# Patient Record
Sex: Female | Born: 1945 | Race: Black or African American | Hispanic: No | Marital: Single | Smoking: Never smoker
Health system: Southern US, Community
[De-identification: ages and names within clinical notes are randomized; demographics above are authoritative.]

## PROBLEM LIST (undated history)

## (undated) DIAGNOSIS — E785 Hyperlipidemia, unspecified: Secondary | ICD-10-CM

## (undated) DIAGNOSIS — K5792 Diverticulitis of intestine, part unspecified, without perforation or abscess without bleeding: Secondary | ICD-10-CM

## (undated) DIAGNOSIS — I10 Essential (primary) hypertension: Secondary | ICD-10-CM

## (undated) DIAGNOSIS — D219 Benign neoplasm of connective and other soft tissue, unspecified: Secondary | ICD-10-CM

## (undated) HISTORY — DX: Diverticulitis of intestine, part unspecified, without perforation or abscess without bleeding: K57.92

## (undated) HISTORY — PX: FINGER SURGERY: SHX640

## (undated) HISTORY — DX: Benign neoplasm of connective and other soft tissue, unspecified: D21.9

## (undated) HISTORY — DX: Essential (primary) hypertension: I10

## (undated) HISTORY — DX: Hyperlipidemia, unspecified: E78.5

## (undated) HISTORY — PX: REDUCTION MAMMAPLASTY: SUR839

## (undated) HISTORY — PX: FOOT SURGERY: SHX648

---

## 2005-10-25 ENCOUNTER — Encounter: Admission: RE | Admit: 2005-10-25 | Discharge: 2005-10-25 | Payer: Self-pay | Admitting: Obstetrics and Gynecology

## 2006-11-07 ENCOUNTER — Encounter: Admission: RE | Admit: 2006-11-07 | Discharge: 2006-11-07 | Payer: Self-pay | Admitting: Obstetrics and Gynecology

## 2006-11-20 ENCOUNTER — Encounter: Admission: RE | Admit: 2006-11-20 | Discharge: 2006-11-20 | Payer: Self-pay | Admitting: Obstetrics and Gynecology

## 2007-11-25 ENCOUNTER — Encounter: Admission: RE | Admit: 2007-11-25 | Discharge: 2007-11-25 | Payer: Self-pay | Admitting: Obstetrics and Gynecology

## 2008-06-17 ENCOUNTER — Ambulatory Visit (HOSPITAL_BASED_OUTPATIENT_CLINIC_OR_DEPARTMENT_OTHER): Admission: RE | Admit: 2008-06-17 | Discharge: 2008-06-17 | Payer: Self-pay | Admitting: Orthopedic Surgery

## 2008-06-17 ENCOUNTER — Encounter (INDEPENDENT_AMBULATORY_CARE_PROVIDER_SITE_OTHER): Payer: Self-pay | Admitting: Orthopedic Surgery

## 2008-11-30 ENCOUNTER — Encounter: Admission: RE | Admit: 2008-11-30 | Discharge: 2008-11-30 | Payer: Self-pay | Admitting: Obstetrics and Gynecology

## 2008-12-12 ENCOUNTER — Encounter: Admission: RE | Admit: 2008-12-12 | Discharge: 2008-12-12 | Payer: Self-pay | Admitting: Internal Medicine

## 2009-12-01 ENCOUNTER — Encounter: Admission: RE | Admit: 2009-12-01 | Discharge: 2009-12-01 | Payer: Self-pay | Admitting: Obstetrics and Gynecology

## 2010-03-25 ENCOUNTER — Encounter: Payer: Self-pay | Admitting: Obstetrics and Gynecology

## 2010-06-13 LAB — BASIC METABOLIC PANEL
BUN: 7 mg/dL (ref 6–23)
CO2: 28 mEq/L (ref 19–32)
Calcium: 9 mg/dL (ref 8.4–10.5)
Chloride: 105 mEq/L (ref 96–112)
Creatinine, Ser: 0.79 mg/dL (ref 0.4–1.2)
GFR calc Af Amer: >60 mL/min (ref >60)
GFR calc non Af Amer: >60 mL/min (ref >60)
Glucose, Bld: 115 mg/dL — ABNORMAL HIGH (ref 70–99)
Potassium: 4.4 mEq/L (ref 3.5–5.1)
Sodium: 138 mEq/L (ref 135–145)

## 2010-06-13 LAB — POCT HEMOGLOBIN-HEMACUE: Hemoglobin: 14.7 g/dL (ref 12.0–15.0)

## 2010-07-17 NOTE — Op Note (Signed)
NAMECERITA, RABELO               ACCOUNT NO.:  [id number]   MEDICAL RECORD NO.:  [id number]          PATIENT TYPE:  AMB   LOCATION:  DSC                          FACILITY:  [facility]   PHYSICIAN:  Katy Fitch. Sypher, M.D. DATE OF BIRTH:  May 11, 1945   DATE OF PROCEDURE:  06/17/2008  DATE OF DISCHARGE:                               OPERATIVE REPORT   PREOPERATIVE DIAGNOSIS:  Pigmented lesion left index finger radial nail  fold with nail discoloration.   POSTOPERATIVE DIAGNOSIS:  Uncertain pending pathologic evaluation.   OPERATIONS:  En bloc excision left index radial nail fold with a 2-mm  resection of the nail plate and ventral intermediate and dorsal nail  fold followed by primary reconstruction of radial nail wall.   OPERATIONS:  Katy Fitch. Sypher, MD   ASSISTANT:  Annye Rusk, PA-C   ANESTHESIA:  Lidocaine 2% metacarpal head level block left index finger  supplemented by IV sedation.   SUPERVISING ANESTHESIOLOGIST:  Zenon Mayo, MD   INDICATIONS:  Dana Gregory is a 65 year old woman referred for  evaluation and management of a chronic radial nail wall lesion of the  left index finger.  She had been seen by Dr. Campbell Stall in the past for  evaluation and management.  She had had biopsy of the nail wall without  definitive correction.  We are uncertain as to her original diagnosis.  She presented for evaluation of her nail requesting excisional biopsy.  She is brought to the operating room at this time for the biopsy and  definitive diagnosis.   PROCEDURE:  Dana Gregory is brought to the operating room and placed in  supine position upon the operating table.  Following light sedation, the  left arm was prepped with Betadine soap solution and sterilely draped.  A 2% lidocaine metacarpal head level block was placed without  complication.  After few moments, the anesthesia was complete.  The left  index finger was exsanguinated with a gauze wrap and a 1/2-inch  Penrose  drain placed as a digital tourniquet.  The procedure commenced with a  split of the nail just ulnar to the anticipated line of resection.  An  en bloc resection of the entire lesion with a 2-mm margin was  accomplished including the entire dorsal, intermediate and ventral nail  fold on the radial aspect of the fingernail.  This was taken directly to  the periosteum of the distal phalanx and complete excision including the  periosteum was accomplished.  The mass was passed in formalin for  pathologic evaluation.  The wound was then repaired with mattress  sutures of 5-0 nylon reconstructing the radial nail wall.  There were no  apparent complications.   Dana Gregory tolerated the surgery and anesthesia well.  She was placed in a  compressive dressing with Xeroflo sterile gauze and Coban.   We will see her back for followup in the office in 5-7 days for dressing  change and review of her pathology.      Katy Fitch Sypher, M.D.  Electronically Signed     RVS/MEDQ  D:  06/17/2008  T:  06/18/2008  Job:  [id number]   cc:   Dr. Constance Goltz

## 2010-09-28 IMAGING — CT CT ABDOMEN W/ CM
2 of 5 series · 17 of 46 positions shown, 19 images · IV contrast (CONTRAST)
Comparison: None

CT ABDOMEN

CLINICAL DATA: Persistent abdominal pain over last year

CT ABDOMEN AND PELVIS WITH CONTRAST
TECHNIQUE: Multidetector CT imaging of the abdomen and pelvis was
performed using the standard protocol following bolus
administration of intravenous contrast.
Contrast: 100 ml [id number]
BUN and creatinine were obtained on site.  Results:  BUN 10 mg/dL,
Creatinine 0.9 mg/dL.

[Series 2: portal · axial · portal-venous · 0.68mm/px · z∈[+1219,+1599]mm · 14 of 86 slices shown, 16 images]
[im 5/86  soft-tissue]
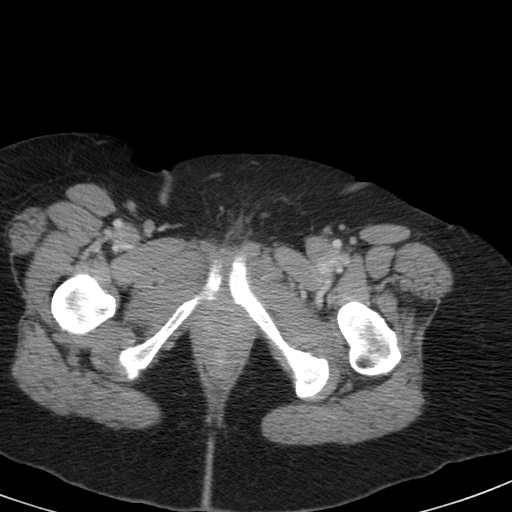
[im 5/86  bone]
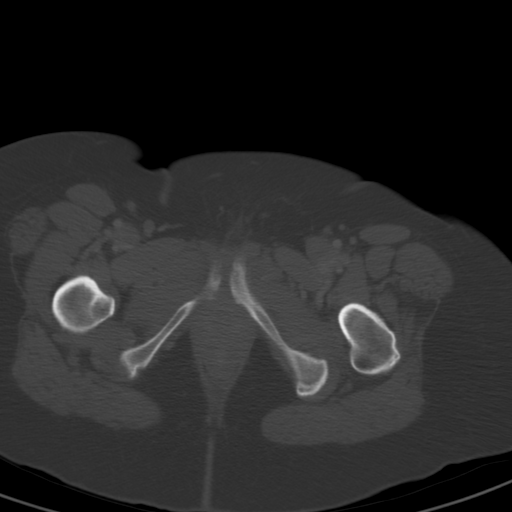
[im 10/86  soft-tissue]
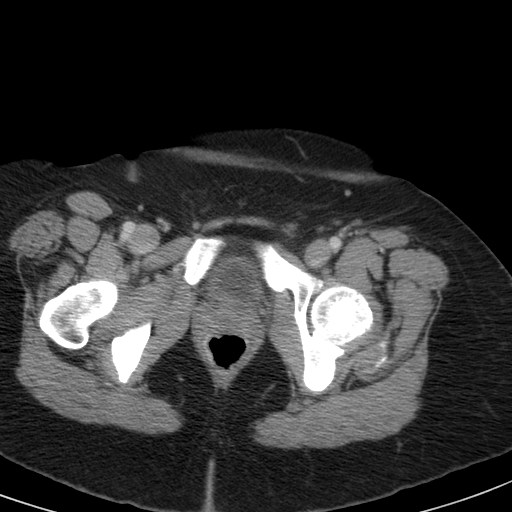
[im 19/86  soft-tissue]
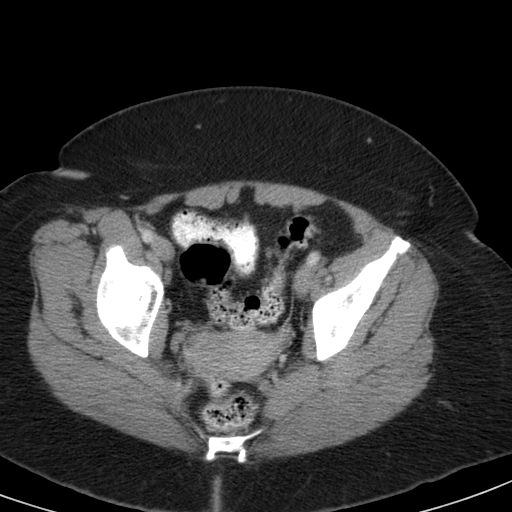
[im 24/86  soft-tissue]
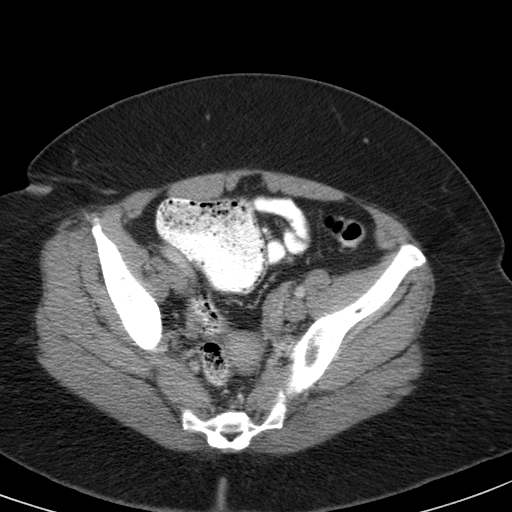
[im 29/86  soft-tissue]
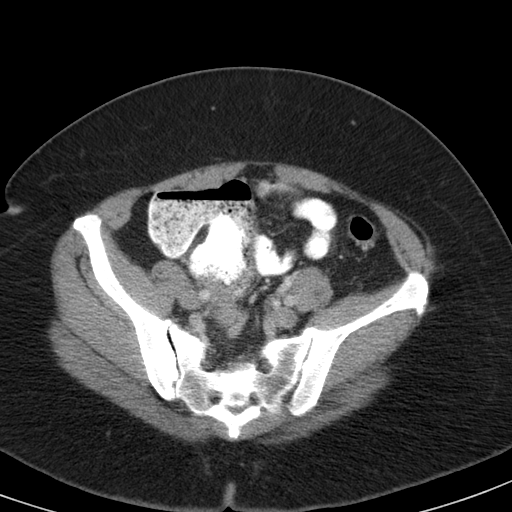
[im 34/86  soft-tissue]
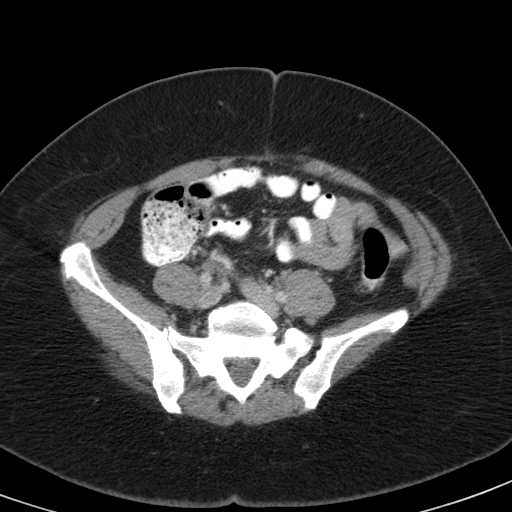
[im 38/86  soft-tissue]
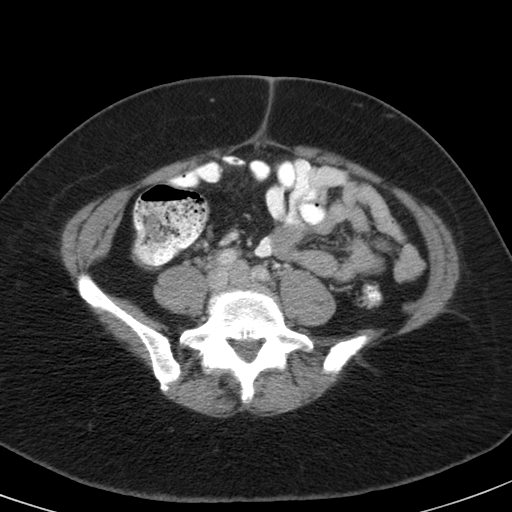
[im 48/86  soft-tissue]
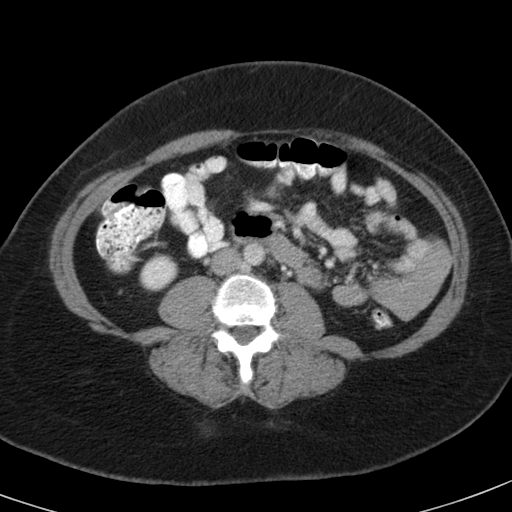
[im 52/86  soft-tissue]
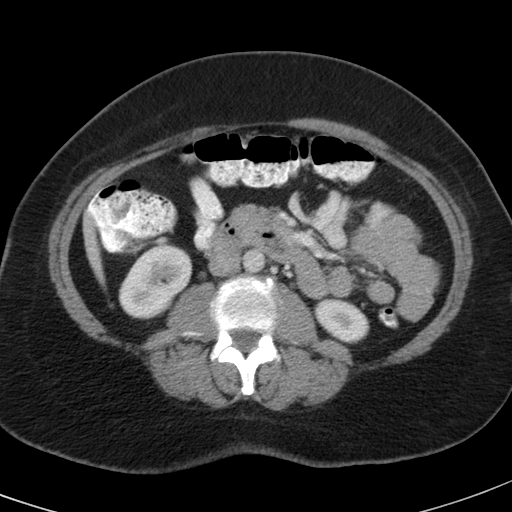
[im 52/86  bone]
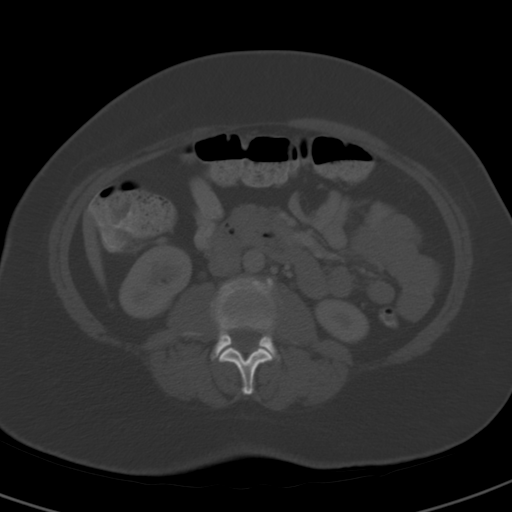
[im 57/86  soft-tissue]
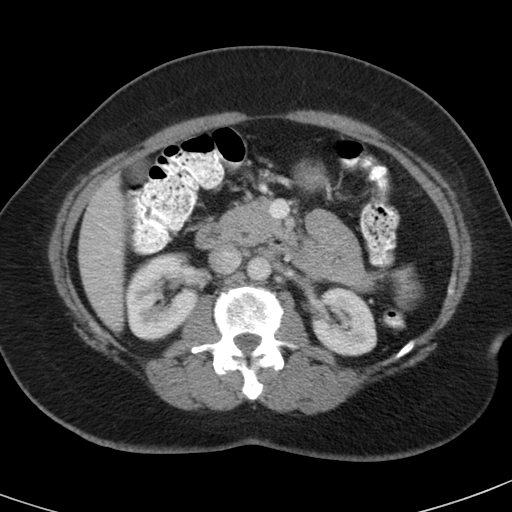
[im 62/86  soft-tissue]
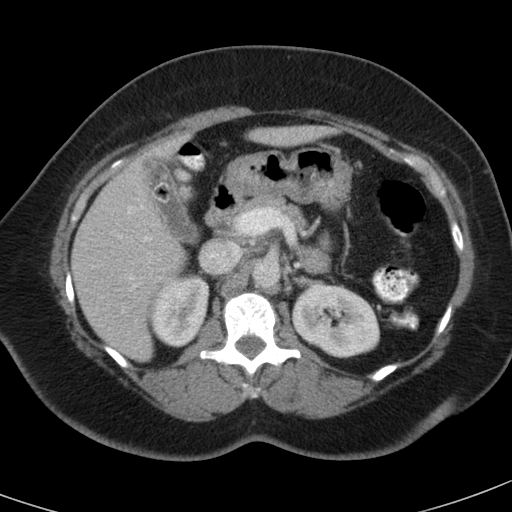
[im 67/86  soft-tissue]
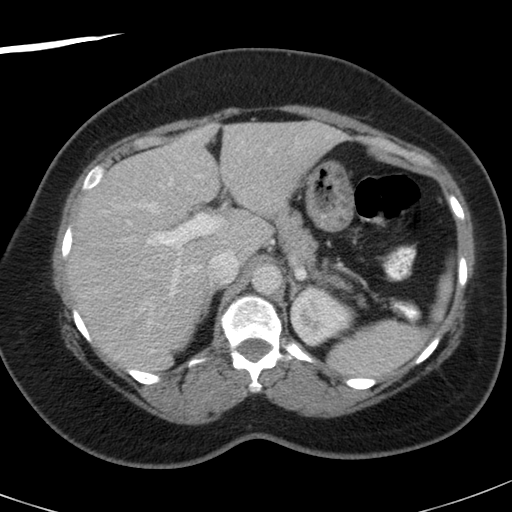
[im 76/86  soft-tissue]
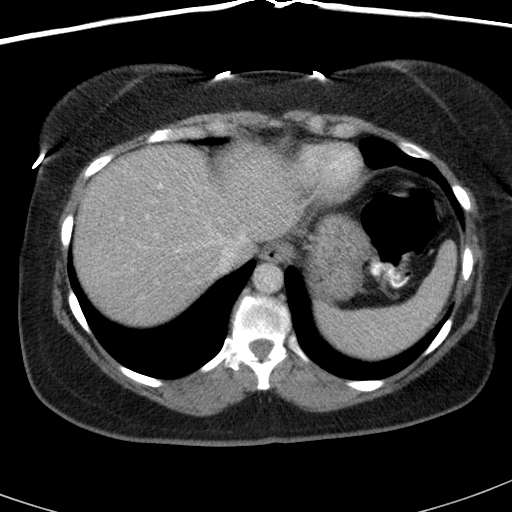
[im 81/86  soft-tissue]
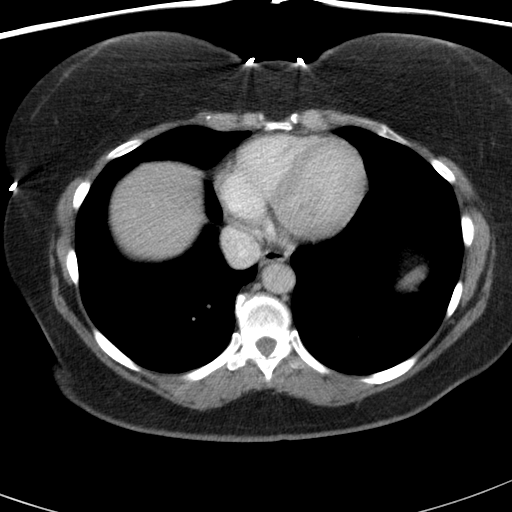

[coronals · coronal · 0.83mm/px · 3 of 76 slices shown]
[im 26/76  soft-tissue]
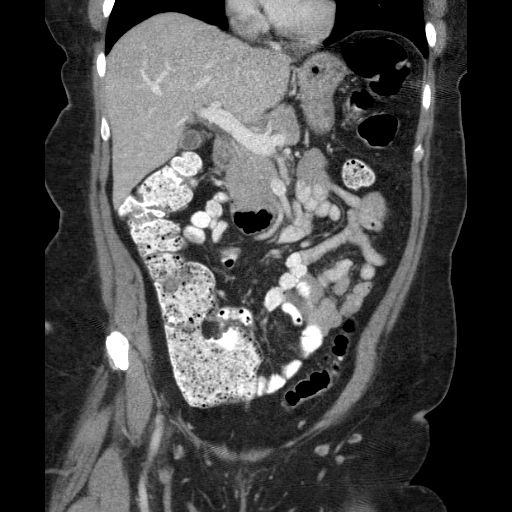
[im 34/76  soft-tissue]
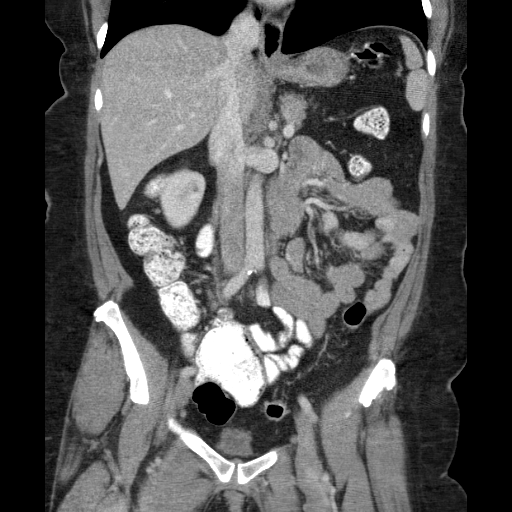
[im 42/76  soft-tissue]
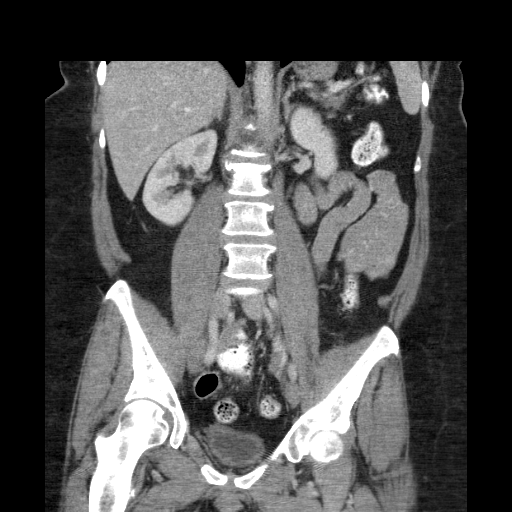

[17 of 46 positions shown; findings below may reference images not displayed]

FINDINGS: The lung bases are clear.  The liver enhances with no
focal abnormality and no ductal dilatation is seen.  Several
gallstones are present within the gallbladder with one of the
larger stones measuring 14 mm in maximum diameter.  No gallbladder
wall thickening is seen.  The pancreas is normal in size and the
pancreatic duct is not dilated.  The adrenal glands and spleen
appear normal.  The stomach is not well distended.  The kidneys
enhance with no calculi or mass.  On delayed images the
pelvocaliceal systems appear normal.  The abdominal aorta is normal
in caliber.  No adenopathy is seen.
IMPRESSION: Several gallstones within the gallbladder.  No ductal dilatation
or gallbladder wall thickening is seen.

CT PELVIS
FINDINGS: No colonic abnormality is seen.  There are a few
rectosigmoid colonic diverticula present.  The uterus is normal in
size.  No adnexal lesion is seen.  No fluid is noted within the
pelvis.  The urinary bladder is not well distended and is therefore
difficult to assess.  The terminal ileum is well seen and appears
normal.  There are degenerative changes involving the facet joints
of the lower lumbar spine.
IMPRESSION: 1.  There are a few rectosigmoid colonic diverticula present.  No
diverticulitis is seen.
2.  Degenerative change involves the facet joints of the lower
lumbar spine.

## 2010-11-22 ENCOUNTER — Other Ambulatory Visit: Payer: Self-pay | Admitting: Obstetrics and Gynecology

## 2010-11-22 DIAGNOSIS — Z1231 Encounter for screening mammogram for malignant neoplasm of breast: Secondary | ICD-10-CM

## 2010-12-03 ENCOUNTER — Ambulatory Visit
Admission: RE | Admit: 2010-12-03 | Discharge: 2010-12-03 | Disposition: A | Discharge status: Home / Self Care | Payer: Medicare Other | Source: Ambulatory Visit | Attending: Obstetrics and Gynecology | Admitting: Obstetrics and Gynecology

## 2010-12-03 DIAGNOSIS — Z1231 Encounter for screening mammogram for malignant neoplasm of breast: Secondary | ICD-10-CM

## 2011-04-22 ENCOUNTER — Other Ambulatory Visit: Payer: Self-pay | Admitting: Obstetrics and Gynecology

## 2011-04-22 ENCOUNTER — Other Ambulatory Visit (HOSPITAL_COMMUNITY)
Admission: RE | Admit: 2011-04-22 | Discharge: 2011-04-22 | Disposition: A | Discharge status: Home / Self Care | Payer: Medicare Other | Source: Ambulatory Visit | Attending: Obstetrics and Gynecology | Admitting: Obstetrics and Gynecology

## 2011-04-22 DIAGNOSIS — Z124 Encounter for screening for malignant neoplasm of cervix: Secondary | ICD-10-CM | POA: Insufficient documentation

## 2011-11-06 ENCOUNTER — Other Ambulatory Visit: Payer: Self-pay | Admitting: Obstetrics and Gynecology

## 2011-11-06 DIAGNOSIS — Z1231 Encounter for screening mammogram for malignant neoplasm of breast: Secondary | ICD-10-CM

## 2011-12-06 ENCOUNTER — Ambulatory Visit
Admission: RE | Admit: 2011-12-06 | Discharge: 2011-12-06 | Disposition: A | Discharge status: Home / Self Care | Payer: Medicare Other | Source: Ambulatory Visit | Attending: Obstetrics and Gynecology | Admitting: Obstetrics and Gynecology

## 2011-12-06 DIAGNOSIS — Z1231 Encounter for screening mammogram for malignant neoplasm of breast: Secondary | ICD-10-CM

## 2012-11-27 ENCOUNTER — Other Ambulatory Visit: Payer: Self-pay

## 2012-11-27 DIAGNOSIS — Z1231 Encounter for screening mammogram for malignant neoplasm of breast: Secondary | ICD-10-CM

## 2012-12-25 ENCOUNTER — Ambulatory Visit
Admission: RE | Admit: 2012-12-25 | Discharge: 2012-12-25 | Disposition: A | Discharge status: Home / Self Care | Payer: Medicare Other | Source: Ambulatory Visit

## 2012-12-25 DIAGNOSIS — Z1231 Encounter for screening mammogram for malignant neoplasm of breast: Secondary | ICD-10-CM

## 2013-10-25 ENCOUNTER — Other Ambulatory Visit: Payer: Self-pay | Admitting: *Deleted

## 2013-10-25 ENCOUNTER — Encounter: Payer: Self-pay | Admitting: Obstetrics & Gynecology

## 2013-10-25 ENCOUNTER — Ambulatory Visit (INDEPENDENT_AMBULATORY_CARE_PROVIDER_SITE_OTHER): Payer: Medicare Other | Admitting: Obstetrics & Gynecology

## 2013-10-25 VITALS — BP 160/96 | Temp 98.1°F | Ht 64.0 in | Wt 204.0 lb

## 2013-10-25 DIAGNOSIS — N899 Noninflammatory disorder of vagina, unspecified: Secondary | ICD-10-CM

## 2013-10-25 DIAGNOSIS — N898 Other specified noninflammatory disorders of vagina: Secondary | ICD-10-CM

## 2013-10-25 LAB — POCT URINALYSIS DIPSTICK
Bilirubin, UA: NEGATIVE
Glucose, UA: NEGATIVE
Ketones, UA: NEGATIVE
Leukocytes, UA: NEGATIVE
Nitrite, UA: NEGATIVE
Protein, UA: NEGATIVE
Spec Grav, UA: 1.015
Urobilinogen, UA: NEGATIVE
pH, UA: 6

## 2013-10-25 MED ORDER — CLINDAMYCIN PHOSPHATE (1 DOSE) 2 % VA CREA: 1.0000 | TOPICAL_CREAM | Freq: Once | VAGINAL | Status: DC

## 2013-10-26 LAB — WET PREP BY MOLECULAR PROBE
Candida species: POSITIVE — AB
Gardnerella vaginalis: NEGATIVE
Trichomonas vaginosis: NEGATIVE

## 2013-10-27 LAB — POCT WET PREP (WET MOUNT)
Clue Cells Wet Prep Whiff POC: NEGATIVE
KOH Wet Prep POC: NEGATIVE

## 2013-10-27 NOTE — Progress Notes (Signed)
Patient ID: Dana Gregory, female   DOB: 05/28/1945, 68 y.o.   MRN: [id number]  Chief Complaint  Patient presents with  . Problem    Vaginal discharge/irritation    HPI Dana Gregory is a 68 y.o. female.   Vaginal Discharge The patient's primary symptoms include a vaginal discharge. Primary symptoms comment: vaginal irritattion. The current episode started 1 to 4 weeks ago. The problem affects both sides. Pertinent negatives include no rash.  Reports frequent bubble baths.  Past Medical History  Diagnosis Date  . Hypertension   . Hyperlipidemia   . Diverticulitis   . Fibroid     Past Surgical History  Procedure Laterality Date  . Foot surgery    . Finger surgery      Family History  Problem Relation Age of Onset  . Heart disease Mother   . Hypertension Mother     Social History History  Substance Use Topics  . Smoking status: Never Smoker   . Smokeless tobacco: Never Used  . Alcohol Use: No    No Known Allergies  Current Outpatient Prescriptions  Medication Sig Dispense Refill  . AMLODIPINE BESYLATE PO Take 5 mg by mouth.      . pravastatin (PRAVACHOL) 40 MG tablet Take 40 mg by mouth daily.      . Clindamycin Phosphate, 1 Dose, (CLINDESSE) vaginal cream Apply 1 Applicatorful topically once.  5.8 g  0   No current facility-administered medications for this visit.    Review of Systems Review of Systems  Genitourinary: Positive for vaginal discharge.  Skin: Negative for rash.   Constitutional: negative for fatigue and weight loss Respiratory: negative for cough and wheezing Cardiovascular: negative for chest pain, fatigue and palpitations Gastrointestinal: negative for abdominal pain and change in bowel habits Integument/breast: negative for nipple discharge Musculoskeletal:negative for myalgias Neurological: negative for gait problems and tremors Behavioral/Psych: negative for abusive relationship, depression Endocrine: negative for temperature  intolerance     Blood pressure 160/96, temperature 98.1 F (36.7 C), height 5\' 4"  (1.626 m), weight 92.534 kg (204 lb).  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 WET PREP BY MOLECULAR PROBE  . POCT urinalysis dipstick   Meds ordered this encounter  Medications  . AMLODIPINE BESYLATE PO    Sig: Take 5 mg by mouth.  . pravastatin (PRAVACHOL) 40 MG tablet    Sig: Take 40 mg by mouth daily.  Marland Kitchen DISCONTD: Clindamycin Phosphate, 1 Dose, (CLINDESSE) vaginal cream    Sig: Apply 1 Applicatorful topically once.    Dispense:  5.8 g    Refill:  0  . Clindamycin Phosphate, 1 Dose, (CLINDESSE) vaginal cream    Sig: Apply 1 Applicatorful topically once.    Dispense:  5.8 g    Refill:  0     Possible management options include: counseled re: avoidance of irritants Follow up as needed.        JACKSON-MOORE,Alver Leete A 10/27/2013, 7:37 PM

## 2013-10-27 NOTE — Patient Instructions (Signed)

## 2013-11-11 ENCOUNTER — Ambulatory Visit: Payer: Medicare Other | Admitting: Obstetrics & Gynecology

## 2013-12-23 ENCOUNTER — Other Ambulatory Visit: Payer: Self-pay

## 2013-12-23 DIAGNOSIS — Z1231 Encounter for screening mammogram for malignant neoplasm of breast: Secondary | ICD-10-CM

## 2014-01-10 ENCOUNTER — Ambulatory Visit: Payer: Medicare Other

## 2014-02-28 ENCOUNTER — Encounter: Payer: Self-pay | Admitting: *Deleted

## 2014-03-01 ENCOUNTER — Encounter: Payer: Self-pay | Admitting: Obstetrics & Gynecology

## 2014-03-02 ENCOUNTER — Ambulatory Visit
Admission: RE | Admit: 2014-03-02 | Discharge: 2014-03-02 | Disposition: A | Discharge status: Home / Self Care | Payer: Medicare Other | Source: Ambulatory Visit

## 2014-03-02 ENCOUNTER — Encounter (INDEPENDENT_AMBULATORY_CARE_PROVIDER_SITE_OTHER): Payer: Self-pay

## 2014-03-02 DIAGNOSIS — Z1231 Encounter for screening mammogram for malignant neoplasm of breast: Secondary | ICD-10-CM

## 2015-04-10 ENCOUNTER — Other Ambulatory Visit: Payer: Self-pay

## 2015-04-10 DIAGNOSIS — Z1231 Encounter for screening mammogram for malignant neoplasm of breast: Secondary | ICD-10-CM

## 2015-04-25 ENCOUNTER — Ambulatory Visit
Admission: RE | Admit: 2015-04-25 | Discharge: 2015-04-25 | Disposition: A | Discharge status: Home / Self Care | Payer: Medicare Other | Source: Ambulatory Visit

## 2015-04-25 DIAGNOSIS — Z1231 Encounter for screening mammogram for malignant neoplasm of breast: Secondary | ICD-10-CM

## 2016-04-16 ENCOUNTER — Other Ambulatory Visit: Payer: Self-pay | Admitting: Family Medicine

## 2016-04-16 DIAGNOSIS — Z9889 Other specified postprocedural states: Secondary | ICD-10-CM

## 2016-04-16 DIAGNOSIS — Z1231 Encounter for screening mammogram for malignant neoplasm of breast: Secondary | ICD-10-CM

## 2016-05-14 ENCOUNTER — Ambulatory Visit: Payer: Medicare Other

## 2016-05-22 ENCOUNTER — Ambulatory Visit: Payer: Medicare Other

## 2016-06-10 ENCOUNTER — Ambulatory Visit
Admission: RE | Admit: 2016-06-10 | Discharge: 2016-06-10 | Disposition: A | Discharge status: Home / Self Care | Payer: Medicare Other | Source: Ambulatory Visit | Attending: Family Medicine | Admitting: Family Medicine

## 2016-06-10 DIAGNOSIS — Z1231 Encounter for screening mammogram for malignant neoplasm of breast: Secondary | ICD-10-CM

## 2016-06-10 DIAGNOSIS — Z9889 Other specified postprocedural states: Secondary | ICD-10-CM

## 2017-05-05 ENCOUNTER — Other Ambulatory Visit: Payer: Self-pay | Admitting: Family Medicine

## 2017-05-05 DIAGNOSIS — Z1231 Encounter for screening mammogram for malignant neoplasm of breast: Secondary | ICD-10-CM

## 2017-06-11 ENCOUNTER — Ambulatory Visit
Admission: RE | Admit: 2017-06-11 | Discharge: 2017-06-11 | Disposition: A | Discharge status: Home / Self Care | Payer: Medicare Other | Source: Ambulatory Visit | Attending: Family Medicine | Admitting: Family Medicine

## 2017-06-11 DIAGNOSIS — Z1231 Encounter for screening mammogram for malignant neoplasm of breast: Secondary | ICD-10-CM

## 2018-05-06 ENCOUNTER — Encounter: Payer: Self-pay | Admitting: Obstetrics & Gynecology

## 2018-05-06 ENCOUNTER — Ambulatory Visit (INDEPENDENT_AMBULATORY_CARE_PROVIDER_SITE_OTHER): Payer: Medicare Other | Admitting: Obstetrics & Gynecology

## 2018-05-06 VITALS — BP 138/68 | HR 85 | Ht 63.0 in | Wt 212.1 lb

## 2018-05-06 DIAGNOSIS — N9489 Other specified conditions associated with female genital organs and menstrual cycle: Secondary | ICD-10-CM | POA: Diagnosis not present

## 2018-05-06 NOTE — Progress Notes (Signed)
Subjective:     Dana Gregory is a 73 y.o. female here for a routine exam.  G1P1 s/p SVD x1 in 1968. LMP early 15's. Pt did not breastfeed. Pt reports that she was on contraception for ~2 year. Pt was never on HRT. Current complaints: Pt had a CT scan that showed a mass on her ovary. She asked for a CT scan because of pancreatic cancer in her sister. She reports that she was having some 'stomach' issues prior to that. She had a f/u MRI which showed fibroids.   Pt has a h/o fibroids. Pt denies pelvic pain. No N/V.   Pt denies early satiety. No tighter.     FH: sister died pancreatic cancer. Mother died at 57 heart disease;  Father 87 died MI; sister- unknown- 'she was a bleeder'  IGynecologic History No LMP recorded. Patient is postmenopausal. Contraception: post menopausal status Last Pap: 2019. Results were: at [place] Last mammogram: 06/11/2017. Results were: normal  Obstetric History OB History  Gravida Para Term Preterm AB Living  1 1 1     1   SAB TAB Ectopic Multiple Live Births          1    # Outcome Date GA Lbr Len/2nd Weight Sex Delivery Anes PTL Lv  1 Term 1968 [redacted]w[redacted]d   F Vag-Spont EPI N LIV     The following portions of the patient's history were reviewed and updated as appropriate: allergies, current medications, past family history, past medical history, past social history, past surgical history and problem list.  Review of Systems Pertinent items are noted in HPI.    Objective:  BP 138/68   Pulse 85   Ht 5\' 3"  (1.6 m)   Wt 212 lb 1.3 oz (96.2 kg)   BMI 37.57 kg/m   CONSTITUTIONAL: Well-developed, well-nourished female in no acute distress.  HENT:  Normocephalic, atraumatic EYES: Conjunctivae and EOM are normal. No scleral icterus.  NECK: Normal range of motion SKIN: Skin is warm and dry. No rash noted. Not diaphoretic.No pallor. [place]: Alert and oriented to person, place, and time. Normal coordination.  Lungs: CTA CV: RRR Abd: obese; NT,  ND GU: EGBUS: no lesions Vagina: no blood in vault Cervix: no lesion; no mucopurulent d/c Uterus: small, mobile Adnexa: no masses palpated but, the exam is limited by pts body habitus.    04/03/2018 CLINICAL DATA: Left pelvic mass on recent CT.  EXAM: MRI PELVIS WITHOUT AND WITH CONTRAST  TECHNIQUE: Multiplanar multisequence MR imaging of the pelvis was performed both before and after administration of intravenous contrast.  CONTRAST: 18 cc MultiHance  COMPARISON: CT scan 04/03/2018  FINDINGS: Urinary Tract: Urinary bladder unremarkable. Urethra within normal limits. No urethral diverticulum.  Bowel: No small bowel or colonic dilatation within the visualized pelvis. Diverticular changes are noted in the left colon.  Vascular/Lymphatic: Normal flow signal identified in the external iliac artery and vein bilaterally. No pelvic sidewall lymphadenopathy.  Reproductive: Uterus measures 5.9 x 3.7 x 4.5 cm. Multiple small intramural fibroids are evident.  In the left pelvis, cranial to the bladder and anterior to the uterine fundus is a 7.0 x 5.7 x 5.6 cm heterogeneous well-defined mass. This generates mild mass-effect on the uterine fundus but does not appear to arise from the uterus itself. Both the previous CT and today's MRI suggests that the left gonadal vessels track into the lateral aspect of this lesion suggesting that is left ovarian in origin. The lesion has cystic and solid components  and is predominantly T2 hypointense with relatively diffuse enhancement after IV contrast administration, suggesting predominantly solid features.  Right ovary is high in the right pelvis and measures 1.1 x 2.3 x 2.5 cm.  Other: Trace fluid noted adjacent to the left adnexal lesion.  Musculoskeletal: No abnormal marrow enhancement within the visualized bony anatomy.  IMPRESSION: 1. 7 x 6 x 6 cm predominantly solid, well-defined, enhancing mass in the left adnexal space  appears to be of left ovarian origin. This lesion is discrete from the uterine fundus. 2. No evidence for lymphadenopathy in the pelvis. No gross ascites although there may be trace fluid adjacent to the left adnexal lesion. 3. Intramural uterine fibroids.   Electronically Signed  By: Misty Stanley M.D.  On: 04/20/2018 13:43  Other Result Information  Interface, Rad Results In - 04/20/2018  1:46 PM EST CLINICAL DATA:  Left pelvic mass on recent CT.  EXAM: MRI PELVIS WITHOUT AND WITH CONTRAST  TECHNIQUE: Multiplanar multisequence MR imaging of the pelvis was performed both before and after administration of intravenous contrast.  CONTRAST:  18 cc MultiHance  COMPARISON:  CT scan 04/03/2018  FINDINGS: Urinary Tract: Urinary bladder unremarkable. Urethra within normal limits. No urethral diverticulum.  Bowel: No small bowel or colonic dilatation within the visualized pelvis. Diverticular changes are noted in the left colon.  Vascular/Lymphatic: Normal flow signal identified in the external iliac artery and vein bilaterally. No pelvic sidewall lymphadenopathy.  Reproductive: Uterus measures 5.9 x 3.7 x 4.5 cm. Multiple small intramural fibroids are evident.  In the left pelvis, cranial to the bladder and anterior to the uterine fundus is a 7.0 x 5.7 x 5.6 cm heterogeneous well-defined mass. This generates mild mass-effect on the uterine fundus but does not appear to arise from the uterus itself. Both the previous CT and today's MRI suggests that the left gonadal vessels track into the lateral aspect of this lesion suggesting that is left ovarian in origin. The lesion has cystic and solid components and is predominantly T2 hypointense with relatively diffuse enhancement after IV contrast administration, suggesting predominantly solid features.  Right ovary is high in the right pelvis and measures 1.1 x 2.3 x 2.5 cm.  Other:  Trace fluid noted adjacent to the left  adnexal lesion.  Musculoskeletal: No abnormal marrow enhancement within the visualized bony anatomy.  IMPRESSION: 1. 7 x 6 x 6 cm predominantly solid, well-defined, enhancing mass in the left adnexal space appears to be of left ovarian origin. This lesion is discrete from the uterine fundus. 2. No evidence for lymphadenopathy in the pelvis. No gross ascites although there may be trace fluid adjacent to the left adnexal lesion. 3. Intramural uterine fibroids.   Electronically Signed   By: Misty Stanley M.D.   On: 04/20/2018 13:43  Status Results Details   Encounter Summary  January CT ABDOMEN PELVIS W CONTRAST (ROUTINE)04/03/2018 [facility] Result Narrative  CLINICAL DATA: Left upper quadrant pain, epigastric pain, nausea, vomiting  EXAM: CT ABDOMEN AND PELVIS WITH CONTRAST  TECHNIQUE: Multidetector CT imaging of the abdomen and pelvis was performed using the standard protocol following bolus administration of intravenous contrast.  CONTRAST: 90 mL Omnipaque 350  COMPARISON: 12/12/2008  FINDINGS: Lower chest: Lung bases are clear. No effusions. Heart is normal size.  Hepatobiliary: Gallbladder is filled with gallstones. No focal hepatic abnormality or biliary ductal dilatation.  Pancreas: No focal abnormality or ductal dilatation.  Spleen: No focal abnormality. Normal size.  Adrenals/Urinary Tract: No adrenal abnormality. No  focal renal abnormality. No stones or hydronephrosis. Urinary bladder is unremarkable.  Stomach/Bowel: Left colonic diverticulosis. No active diverticulitis. Appendix is normal. Stomach and small bowel decompressed, unremarkable.  Vascular/Lymphatic: No evidence of aneurysm or adenopathy.  Reproductive: Large midline pelvic mass along the anterior superior aspect of the uterus measures 7.1 x 6.0 cm. This is new since 2010.  Other: No free fluid or free air.  Musculoskeletal: No acute bony abnormality.  Degenerative changes in the lumbar spine.  IMPRESSION: Left colonic diverticulosis. No active diverticulitis.  Gallstones fill the gallbladder. No CT evidence for acute cholecystitis.  7.1 cm solid mass in the midline of the pelvis. This could reflect a pedunculated fibroid from the anterior superior uterus. However, given the patient's age and the fact that this was not present on prior study from 2010 raises the possibility of other soft tissue mass, possibly arising from the left ovary. This would be best evaluated with pelvic MRI.   Electronically Signed  By: Rolm Baptise M.D.  On: 04/03/2018 15:57  Other Result Information  Interface, Rad Results In - 04/03/2018  4:00 PM EST CLINICAL DATA:  Left upper quadrant pain, epigastric pain, nausea, vomiting  EXAM: CT ABDOMEN AND PELVIS WITH CONTRAST  TECHNIQUE: Multidetector CT imaging of the abdomen and pelvis was performed using the standard protocol following bolus administration of intravenous contrast.  CONTRAST:  90 mL Omnipaque 350  COMPARISON:  12/12/2008  FINDINGS: Lower chest: Lung bases are clear. No effusions. Heart is normal size.  Hepatobiliary: Gallbladder is filled with gallstones. No focal hepatic abnormality or biliary ductal dilatation.  Pancreas: No focal abnormality or ductal dilatation.  Spleen: No focal abnormality.  Normal size.  Adrenals/Urinary Tract: No adrenal abnormality. No focal renal abnormality. No stones or hydronephrosis. Urinary bladder is unremarkable.  Stomach/Bowel: Left colonic diverticulosis. No active diverticulitis. Appendix is normal. Stomach and small bowel decompressed, unremarkable.  Vascular/Lymphatic: No evidence of aneurysm or adenopathy.  Reproductive: Large midline pelvic mass along the anterior superior aspect of the uterus measures 7.1 x 6.0 cm. This is new since 2010.  Other: No free fluid or free air.  Musculoskeletal: No acute bony abnormality.  Degenerative changes in the lumbar spine.  IMPRESSION: Left colonic diverticulosis.  No active diverticulitis.  Gallstones fill the gallbladder. No CT evidence for acute cholecystitis.  7.1 cm solid mass in the midline of the pelvis. This could reflect a pedunculated fibroid from the anterior superior uterus. However, given the patient's age and the fact that this was not present on prior study from 2010 raises the possibility of other soft tissue mass, possibly arising from the left ovary. This would be best evaluated with pelvic MRI.    04/20/2018 CLINICAL DATA: Left pelvic mass on recent CT.  EXAM: MRI PELVIS WITHOUT AND WITH CONTRAST  TECHNIQUE: Multiplanar multisequence MR imaging of the pelvis was performed both before and after administration of intravenous contrast.  CONTRAST: 18 cc MultiHance  COMPARISON: CT scan 04/03/2018  FINDINGS: Urinary Tract: Urinary bladder unremarkable. Urethra within normal limits. No urethral diverticulum.  Bowel: No small bowel or colonic dilatation within the visualized pelvis. Diverticular changes are noted in the left colon.  Vascular/Lymphatic: Normal flow signal identified in the external iliac artery and vein bilaterally. No pelvic sidewall lymphadenopathy.  Reproductive: Uterus measures 5.9 x 3.7 x 4.5 cm. Multiple small intramural fibroids are evident.  In the left pelvis, cranial to the bladder and anterior to the uterine fundus is a 7.0 x 5.7 x 5.6 cm heterogeneous well-defined mass.  This generates mild mass-effect on the uterine fundus but does not appear to arise from the uterus itself. Both the previous CT and today's MRI suggests that the left gonadal vessels track into the lateral aspect of this lesion suggesting that is left ovarian in origin. The lesion has cystic and solid components and is predominantly T2 hypointense with relatively diffuse enhancement after IV contrast administration, suggesting  predominantly solid features.  Right ovary is high in the right pelvis and measures 1.1 x 2.3 x 2.5 cm.  Other: Trace fluid noted adjacent to the left adnexal lesion.  Musculoskeletal: No abnormal marrow enhancement within the visualized bony anatomy.  IMPRESSION: 1. 7 x 6 x 6 cm predominantly solid, well-defined, enhancing mass in the left adnexal space appears to be of left ovarian origin. This lesion is discrete from the uterine fundus. 2. No evidence for lymphadenopathy in the pelvis. No gross ascites although there may be trace fluid adjacent to the left adnexal lesion. 3. Intramural uterine fibroids.   Electronically Signed  By: Misty Stanley M.D.  On: 04/20/2018 13:43  Other Result Information  Interface, Rad Results In - 04/20/2018  1:46 PM EST CLINICAL DATA:  Left pelvic mass on recent CT.  EXAM: MRI PELVIS WITHOUT AND WITH CONTRAST  TECHNIQUE: Multiplanar multisequence MR imaging of the pelvis was performed both before and after administration of intravenous contrast.  CONTRAST:  18 cc MultiHance  COMPARISON:  CT scan 04/03/2018  FINDINGS: Urinary Tract: Urinary bladder unremarkable. Urethra within normal limits. No urethral diverticulum.  Bowel: No small bowel or colonic dilatation within the visualized pelvis. Diverticular changes are noted in the left colon.  Vascular/Lymphatic: Normal flow signal identified in the external iliac artery and vein bilaterally. No pelvic sidewall lymphadenopathy.  Reproductive: Uterus measures 5.9 x 3.7 x 4.5 cm. Multiple small intramural fibroids are evident.  In the left pelvis, cranial to the bladder and anterior to the uterine fundus is a 7.0 x 5.7 x 5.6 cm heterogeneous well-defined mass. This generates mild mass-effect on the uterine fundus but does not appear to arise from the uterus itself. Both the previous CT and today's MRI suggests that the left gonadal vessels track into the lateral aspect of this  lesion suggesting that is left ovarian in origin. The lesion has cystic and solid components and is predominantly T2 hypointense with relatively diffuse enhancement after IV contrast administration, suggesting predominantly solid features.  Right ovary is high in the right pelvis and measures 1.1 x 2.3 x 2.5 cm.  Other:  Trace fluid noted adjacent to the left adnexal lesion.  Musculoskeletal: No abnormal marrow enhancement within the visualized bony anatomy.  IMPRESSION: 1. 7 x 6 x 6 cm predominantly solid, well-defined, enhancing mass in the left adnexal space appears to be of left ovarian origin. This lesion is discrete from the uterine fundus. 2. No evidence for lymphadenopathy in the pelvis. No gross ascites although there may be trace fluid adjacent to the left adnexal lesion. 3. Intramural uterine fibroids.     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 Reviewed the DDx with pt including the possibility of ovarian cancer, a fibroid that is pedunculated that is not noted on MRI vs a benign tumor of the ovary.      //Plan:   ROMA tumor markers Referral to GYN ONC D/w pt that since this is a solid tumor that does not appear to communicate with the uterus would rec removal. Due  to her age, rec referral to [place]. Pt understands need for referral. All questions answered.  Pt counseled to take her daughter to the visit with her.   Total face-to-face time with patient was 30 min.  Greater than 50% was spent in counseling and coordination of care with the patient.   Shylin Keizer L. Harraway-Smith, M.D., Cherlynn June

## 2018-05-07 ENCOUNTER — Encounter: Payer: Self-pay | Admitting: Obstetrics & Gynecology

## 2018-05-07 ENCOUNTER — Telehealth: Payer: Self-pay

## 2018-05-07 LAB — POSTMENOPAUSAL INTERP: LOW

## 2018-05-07 LAB — OVARIAN MALIGNANCY RISK-ROMA
Cancer Antigen (CA) 125: 7.4 U/mL (ref 0.0–38.1)
HE4: 67.9 pmol/L (ref 0.0–96.9)
Postmenopausal ROMA: 0.96
Premenopausal ROMA: 1.38 — ABNORMAL HIGH

## 2018-05-07 LAB — PREMENOPAUSAL INTERP: HIGH

## 2018-05-07 NOTE — Telephone Encounter (Signed)
Called patient and made aware that Dr. Ihor Dow received her lab work back and her tumor markers are low. Patient states understanding and plans to attend appointment with Dr. Ysidro Evert

## 2018-05-13 ENCOUNTER — Inpatient Hospital Stay: Payer: Medicare Other | Attending: Gynecologic Oncology | Admitting: Gynecologic Oncology

## 2018-05-13 ENCOUNTER — Other Ambulatory Visit: Payer: Self-pay

## 2018-05-13 ENCOUNTER — Other Ambulatory Visit: Payer: Self-pay | Admitting: Gynecologic Oncology

## 2018-05-13 ENCOUNTER — Encounter: Payer: Self-pay | Admitting: Gynecologic Oncology

## 2018-05-13 VITALS — BP 143/86 | HR 93 | Temp 98.3°F | Resp 18 | Ht 63.0 in | Wt 213.0 lb

## 2018-05-13 DIAGNOSIS — I10 Essential (primary) hypertension: Secondary | ICD-10-CM | POA: Diagnosis not present

## 2018-05-13 DIAGNOSIS — N83202 Unspecified ovarian cyst, left side: Secondary | ICD-10-CM | POA: Diagnosis present

## 2018-05-13 DIAGNOSIS — E6609 Other obesity due to excess calories: Secondary | ICD-10-CM

## 2018-05-13 DIAGNOSIS — R1900 Intra-abdominal and pelvic swelling, mass and lump, unspecified site: Secondary | ICD-10-CM

## 2018-05-13 DIAGNOSIS — E669 Obesity, unspecified: Secondary | ICD-10-CM | POA: Diagnosis not present

## 2018-05-13 DIAGNOSIS — Z6838 Body mass index (BMI) 38.0-38.9, adult: Secondary | ICD-10-CM | POA: Insufficient documentation

## 2018-05-13 DIAGNOSIS — E7800 Pure hypercholesterolemia, unspecified: Secondary | ICD-10-CM | POA: Diagnosis not present

## 2018-05-13 DIAGNOSIS — Z6837 Body mass index (BMI) 37.0-37.9, adult: Secondary | ICD-10-CM

## 2018-05-13 MED ORDER — IBUPROFEN 600 MG PO TABS: 600.0000 mg | ORAL_TABLET | Freq: Four times a day (QID) | ORAL | 0 refills | Status: DC | PRN

## 2018-05-13 MED ORDER — SENNOSIDES-DOCUSATE SODIUM 8.6-50 MG PO TABS: 2.0000 | ORAL_TABLET | Freq: Every day | ORAL | 1 refills | Status: DC

## 2018-05-13 MED ORDER — TRAMADOL HCL 50 MG PO TABS: 50.0000 mg | ORAL_TABLET | Freq: Four times a day (QID) | ORAL | 0 refills | Status: DC | PRN

## 2018-05-13 NOTE — Patient Instructions (Signed)
Preparing for your Surgery  Plan for surgery on May 28, 2018 with Dr. Everitt Amber at [facility] will be scheduled for a robotic assisted total hysterectomy, bilateral salpingo-oophorectomy, possible staging.   Pre-operative Testing -You will receive a phone call from presurgical testing at [facility] if you have not received a call already to arrange for a pre-operative testing appointment before your surgery.  This appointment normally occurs one to two weeks before your scheduled surgery.   -Bring your insurance card, copy of an advanced directive if applicable, medication list  -At that visit, you will be asked to sign a consent for a possible blood transfusion in case a transfusion becomes necessary during surgery.  The need for a blood transfusion is rare but having consent is a necessary part of your care.     -You should not be taking blood thinners or aspirin at least ten days prior to surgery unless instructed by your surgeon.  -As part of our enhanced surgical recovery pathway, you may be advised to drink a carbohydrate drink the morning of surgery (at least 3 hours before). If you are diabetic, this will be avoided in order to prevent elevated glucose levels.  Day Before Surgery at [place] will be asked to take in a light diet the day before surgery.  Avoid carbonated beverages.  You will be advised to have nothing to eat or drink after midnight the evening before.    Eat a light diet the day before surgery.  Examples including soups, broths, toast, yogurt, mashed potatoes.  Things to avoid include carbonated beverages (fizzy beverages), raw fruits and raw vegetables, or beans.   If your bowels are filled with gas, your surgeon will have difficulty visualizing your pelvic organs which increases your surgical risks.  Your role in recovery Your role is to become active as soon as directed by your doctor, while still giving yourself time to heal.  Rest when  you feel tired. You will be asked to do the following in order to speed your recovery:  - Cough and breathe deeply. This helps toclear and expand your lungs and can prevent pneumonia. You may be given a spirometer to practice deep breathing. A staff member will show you how to use the spirometer. - Do mild physical activity. Walking or moving your legs help your circulation and body functions return to normal. A staff member will help you when you try to walk and will provide you with simple exercises. Do not try to get up or walk alone the first time. - Actively manage your pain. Managing your pain lets you move in comfort. We will ask you to rate your pain on a scale of zero to 10. It is your responsibility to tell your doctor or nurse where and how much you hurt so your pain can be treated.  Special Considerations -If you are diabetic, you may be placed on insulin after surgery to have closer control over your blood sugars to promote healing and recovery.  This does not mean that you will be discharged on insulin.  If applicable, your oral antidiabetics will be resumed when you are tolerating a solid diet.  -Your final pathology results from surgery should be available around one week after surgery and the results will be relayed to you when available.  -Dr. Lahoma Crocker is the Surgeon that assists your GYN Oncologist with surgery.  If you end up staying the night, the next day after your surgery  you will either see Dr. Denman George or Dr. Lahoma Crocker.  -FMLA forms can be faxed to [phone] and please allow 5-7 business days for completion.  Pain Management After Surgery -You have been prescribed your pain medication and bowel regimen medications before surgery so that you can have these available when you are discharged from the hospital. The pain medication is for use ONLY AFTER surgery and a new prescription will not be given.   -Make sure that you have Tylenol and Ibuprofen at home  to use on a regular basis after surgery for pain control. We recommend alternating the medications every hour to six hours since they work differently and are processed in the body differently for pain relief.  -Review the attached handout on narcotic use and their risks and side effects.   Bowel Regimen -You have been prescribed Sennakot-S to take nightly to prevent constipation especially if you are taking the narcotic pain medication intermittently.  It is important to prevent constipation and drink adequate amounts of liquids.  Blood Transfusion Information WHAT IS A BLOOD TRANSFUSION? A transfusion is the replacement of blood or some of its parts. Blood is made up of multiple cells which provide different functions.  Red blood cells carry oxygen and are used for blood loss replacement.  White blood cells fight against infection.  Platelets control bleeding.  Plasma helps clot blood.  Other blood products are available for specialized needs, such as hemophilia or other clotting disorders. BEFORE THE TRANSFUSION  Who gives blood for transfusions?   You may be able to donate blood to be used at a later date on yourself (autologous donation).  Relatives can be asked to donate blood. This is generally not any safer than if you have received blood from a stranger. The same precautions are taken to ensure safety when a relative's blood is donated.  Healthy volunteers who are fully evaluated to make sure their blood is safe. This is blood bank blood. Transfusion therapy is the safest it has ever been in the practice of medicine. Before blood is taken from a donor, a complete history is taken to make sure that person has no history of diseases nor engages in risky social behavior (examples are intravenous drug use or sexual activity with multiple partners). The donor's travel history is screened to minimize risk of transmitting infections, such as malaria. The donated blood is tested for  signs of infectious diseases, such as HIV and hepatitis. The blood is then tested to be sure it is compatible with you in order to minimize the chance of a transfusion reaction. If you or a relative donates blood, this is often done in anticipation of surgery and is not appropriate for emergency situations. It takes many days to process the donated blood. RISKS AND COMPLICATIONS Although transfusion therapy is very safe and saves many lives, the main dangers of transfusion include:   Getting an infectious disease.  Developing a transfusion reaction. This is an allergic reaction to something in the blood you were given. Every precaution is taken to prevent this. The decision to have a blood transfusion has been considered carefully by your caregiver before blood is given. Blood is not given unless the benefits outweigh the risks.

## 2018-05-13 NOTE — Progress Notes (Signed)
Consult Note: Gyn-Onc  Consult was requested by Dr. Ihor Gregory for the evaluation of Dana Gregory 73 y.o. female  CC:  Chief Complaint  Patient presents with  . Pelvic mass    Assessment/Plan:  Dana Gregory  is a 73 y.o.  year old with a 7cm complex left ovarian cyst.  The tumor markers are normal and there are no signs of metastatic disease on imaging, therefore the probability is highest that this is benign. I offered the patient the option of close surveillance, however, she is interested in definitive management and diagnosis with surgery, which I believe is appropriate as she is of average risk for complications.   I discussed surgical options including BSO versus hysterectomy with BSO.  The patient is electing for hysterectomy with BSO.  I discussed that this can be achieved through a minimally invasive route with robotic assistance.  I discussed that this is an outpatient procedure.  We discussed surgical risks including  bleeding, infection, damage to internal organs (such as bladder,ureters, bowels), blood clot, reoperation and rehospitalization.  I discussed that if malignancy is identified on frozen section we will perform staging including lymph node dissection and peritoneal biopsies with omentectomy.  I discussed anticipated recovery postoperative symptoms.  Surgery will be scheduled for later this month after she is undergone her colonoscopy on May 22, 2018.  HPI: Ms Dana Gregory is a 73 year old P1 who is seen in consultation at the request of Dr Dana Gregory for a 7cm left ovarian cystic mass.   The patient was undergoing a CT scan of the abdomen and pelvis ordered by her primary care doctor due to her sister's recent death from pancreatic cancer.  The CT scan was performed on April 03, 2018.  It revealed a normal pancreas and upper abdomen, left colonic diverticulosis, normal appendix, but a large midline pelvic mass was seen along the anterior superior  aspect of the uterus measuring 7.1 x 6 cm.  It was new since 2010.  She then subsequently underwent MRI on April 20, 2018 and this revealed a uterus measuring 5.9 x 3.7 x 4.5 cm with multiple small intramural fibroids.  Was pressing on the uterine fundus but did not appear to arise from the uterus itself.  The previous CT scan and the MRI both identified left gonadal vessels entering the cystic mass.  There is relatively diffuse enhancement after IV administration suggesting a predominantly solid mass.  Tumor markers were drawn including a Ca1 25 which was normal at 7.4, a T4 which was normal at 67.9 and a ROMA goal which was normal and postmenopausal range of 0.96.  The patient is asymptomatic.  She has a history of one prior vaginal delivery no prior abdominal surgeries.  She is treated for hypercholesterolemia and hypertension.  Her family history is significant for a sister who had a diagnosis of pancreatic cancer for which she died.  The patient was recently tested for fecal occult blood testing which was positive and she has a colonoscopy scheduled for May 22, 2018.  He is retired. Her BMI is 38kg/m2.  Current Meds:  Outpatient Encounter Medications as of 05/13/2018  Medication Sig  . AMLODIPINE BESYLATE PO Take 5 mg by mouth.  . pravastatin (PRAVACHOL) 40 MG tablet Take 40 mg by mouth daily.   No facility-administered encounter medications on file as of 05/13/2018.     Allergy: No Known Allergies  Social Hx:   Social History   Socioeconomic History  . Marital status:  Single    Spouse name: Not on file  . Number of children: Not on file  . Years of education: Not on file  . Highest education level: Not on file  Occupational History  . Not on file  Social Needs  . Financial resource strain: Not on file  . Food insecurity:    Worry: Not on file    Inability: Not on file  . Transportation needs:    Medical: Not on file    Non-medical: Not on file  Tobacco Use  .  Smoking status: Never Smoker  . Smokeless tobacco: Never Used  Substance and Sexual Activity  . Alcohol use: No  . Drug use: No  . Sexual activity: Never  Lifestyle  . Physical activity:    Days per week: Not on file    Minutes per session: Not on file  . Stress: Not on file  Relationships  . Social connections:    Talks on phone: Not on file    Gets together: Not on file    Attends religious service: Not on file    Active member of club or organization: Not on file    Attends meetings of clubs or organizations: Not on file    Relationship status: Not on file  . Intimate partner violence:    Fear of current or ex partner: Not on file    Emotionally abused: Not on file    Physically abused: Not on file    Forced sexual activity: Not on file  Other Topics Concern  . Not on file  Social History Narrative  . Not on file    Past Surgical Hx:  Past Surgical History:  Procedure Laterality Date  . FINGER SURGERY    . FOOT SURGERY    . REDUCTION MAMMAPLASTY Bilateral     Past Medical Hx:  Past Medical History:  Diagnosis Date  . Diverticulitis   . Fibroid   . Hyperlipidemia   . Hypertension     Past Gynecological History:  SVD x 1 No LMP recorded. Patient is postmenopausal.  Family Hx:  Family History  Problem Relation Age of Onset  . Heart disease Mother   . Hypertension Mother   . Breast cancer Neg Hx     Review of Systems:  Constitutional  Feels well,    ENT Normal appearing ears and nares bilaterally Skin/Breast  No rash, sores, jaundice, itching, dryness Cardiovascular  No chest pain, shortness of breath, or edema  Pulmonary  No cough or wheeze.  Gastro Intestinal  No nausea, vomitting, or diarrhoea. No bright red blood per rectum, no abdominal pain, change in bowel movement, or constipation.  Genito Urinary  No frequency, urgency, dysuria, no bleeding Musculo Skeletal  No myalgia, arthralgia, joint swelling or pain  Neurologic  No weakness,  numbness, change in gait,  Psychology  No depression, anxiety, insomnia.   Vitals:  Blood pressure (!) 143/86, pulse 93, temperature 98.3 F (36.8 C), temperature source Oral, resp. rate 18, height 5\' 3"  (1.6 m), weight 213 lb (96.6 kg), SpO2 97 %.  Physical Exam: WD in NAD Neck  Supple NROM, without any enlargements.  Lymph Node Survey No cervical supraclavicular or inguinal adenopathy Cardiovascular  Pulse normal rate, regularity and rhythm. S1 and S2 normal.  Lungs  Clear to auscultation bilateraly, without wheezes/crackles/rhonchi. Good air movement.  Skin  No rash/lesions/breakdown  Psychiatry  Alert and oriented to person, place, and time  Abdomen  Normoactive bowel sounds, abdomen soft, non-tender and slightly  obese without evidence of hernia.  Back No CVA tenderness Genito Urinary  Vulva/vagina: Normal external female genitalia.   No lesions. No discharge or bleeding.  Bladder/urethra:  No lesions or masses, well supported bladder  Vagina: normal  Cervix: Normal appearing, no lesions.  Uterus:  Small, mobile, no parametrial involvement or nodularity.  Adnexa: no discretely palpable masses though exam is limited by body habitus Rectal  deferred Extremities  No bilateral cyanosis, clubbing or edema.   Thereasa Solo, MD  05/13/2018, 11:15 AM

## 2018-05-13 NOTE — Addendum Note (Signed)
Addended by: Joylene John D on: 05/13/2018 11:30 AM   Modules accepted: Orders

## 2018-05-14 NOTE — Patient Instructions (Signed)
AERALYN BARNA  05/14/2018       Your procedure is scheduled on: 05-28-2018   Report to [facility] Main  Entrance,  Report to admitting at  5:30 AM    Call this number if you have problems the morning of surgery [phone]       Eat a light diet the day before surgery.  Examples including soups, broths, toast, yogurt, mashed potatoes.  Things to avoid include carbonated beverages (fizzy beverages), raw fruits and raw vegetables, or beans.   If your bowels are filled with gas, your surgeon will have difficulty visualizing your pelvic organs which increases your surgical risks.      Remember: NO SOLID FOOD AFTER MIDNIGHT THE NIGHT PRIOR TO SURGERY.  NOTHING BY MOUTH EXCEPT CLEAR LIQUIDS UNTIL 3 HOURS PRIOR TO SCHEULED SURGERY  4:30 AM. PLEASE FINISH ENSURE DRINK PER SURGEON ORDER 3 HOURS PRIOR TO SCHEDULED SURGERY TIME WHICH NEEDS TO BE COMPLETED @ 4:30 AM.   NOTHING BY MOUTH AFTER 4:30 AM, INCLUDING WATER, CANDY, GUM, MINTS.   BRUSH YOUR TEETH MORNING OF SURGERY AND RINSE YOUR MOUTH OUT         Take these medicines the morning of surgery with A SIP OF WATER:   Pravastatin,  Amlodipine                                   You may not have any metal on your body including hair pins and  Piercings.              Do not wear jewelry, make-up, lotions, powders or perfumes, deodorant              Do not wear nail polish.  Do not shave  48 hours prior to surgery.        Do not bring valuables to the hospital. [place].  Contacts, dentures or bridgework may not be worn into surgery.  Leave suitcase in the car. After surgery it may be brought to your room.         Patients discharged the day of surgery will not be allowed to drive home. IF YOU ARE HAVING SURGERY AND GOING HOME THE SAME DAY, YOU MUST HAVE AN ADULT TO DRIVE YOU HOME AND BE WITH YOU FOR 24 HOURS. YOU MAY GO HOME BY TAXI OR UBER OR  ORTHERWISE, BUT AN ADULT MUST ACCOMPANY YOU HOME AND STAY WITH YOU FOR 24 HOURS.   Name and phone number of your driver:              _____________________________________________________________________      CLEAR LIQUID DIET   Foods Allowed                                                                     Foods Excluded  Coffee and tea, regular and decaf  liquids that you cannot  Plain Jell-O in any flavor                                             see through such as: Fruit ices (not with fruit pulp)                                     milk, soups, orange juice  Iced Popsicles                                    All solid food                                   Cranberry, grape and apple juices Sports drinks like Gatorade Lightly seasoned clear broth or consume(fat free) Sugar, honey syrup   _____________________________________________________________________            [facility] - Preparing for Surgery Before surgery, you can play an important role.  Because skin is not sterile, your skin needs to be as free of germs as possible.  You can reduce the number of germs on your skin by washing with CHG (chlorahexidine gluconate) soap before surgery.  CHG is an antiseptic cleaner which kills germs and bonds with the skin to continue killing germs even after washing. Please DO NOT use if you have an allergy to CHG or antibacterial soaps.  If your skin becomes reddened/irritated stop using the CHG and inform your nurse when you arrive at Short Stay. Do not shave (including legs and underarms) for at least 48 hours prior to the first CHG shower.  You may shave your face/neck. Please follow these instructions carefully:  1.  Shower with CHG Soap the night before surgery and the  morning of Surgery.  2.  If you choose to wash your hair, wash your hair first as usual with your  normal  shampoo.  3.  After you shampoo, rinse your hair and body thoroughly  to remove the  shampoo.                            4.  Use CHG as you would any other liquid soap.  You can apply chg directly  to the skin and wash                       Gently with a scrungie or clean washcloth.  5.  Apply the CHG Soap to your body ONLY FROM THE NECK DOWN.   Do not use on face/ open                           Wound or open sores. Avoid contact with eyes, ears mouth and genitals (private parts).                       Wash face,  Genitals (private parts) with your normal soap.             6.  Wash thoroughly, paying special attention to the area  where your surgery  will be performed.  7.  Thoroughly rinse your body with warm water from the neck down.  8.  DO NOT shower/wash with your normal soap after using and rinsing off  the CHG Soap.             9.  Pat yourself dry with a clean towel.            10.  Wear clean pajamas.            11.  Place clean sheets on your bed the night of your first shower and do not  sleep with pets. Day of Surgery : Do not apply any lotions/deodorants the morning of surgery.  Please wear clean clothes to the hospital/surgery center.  FAILURE TO FOLLOW THESE INSTRUCTIONS MAY RESULT IN THE CANCELLATION OF YOUR SURGERY PATIENT SIGNATURE_________________________________  NURSE SIGNATURE__________________________________  ________________________________________________________________________   Adam Phenix  An incentive spirometer is a tool that can help keep your lungs clear and active. This tool measures how well you are filling your lungs with each breath. Taking long deep breaths may help reverse or decrease the chance of developing breathing (pulmonary) problems (especially infection) following:  A long period of time when you are unable to move or be active. BEFORE THE PROCEDURE   If the spirometer includes an indicator to show your best effort, your nurse or respiratory therapist will set it to a desired goal.  If possible,  sit up straight or lean slightly forward. Try not to slouch.  Hold the incentive spirometer in an upright position. INSTRUCTIONS FOR USE  1. Sit on the edge of your bed if possible, or sit up as far as you can in bed or on a chair. 2. Hold the incentive spirometer in an upright position. 3. Breathe out normally. 4. Place the mouthpiece in your mouth and seal your lips tightly around it. 5. Breathe in slowly and as deeply as possible, raising the piston or the ball toward the top of the column. 6. Hold your breath for 3-5 seconds or for as long as possible. Allow the piston or ball to fall to the bottom of the column. 7. Remove the mouthpiece from your mouth and breathe out normally. 8. Rest for a few seconds and repeat Steps 1 through 7 at least 10 times every 1-2 hours when you are awake. Take your time and take a few normal breaths between deep breaths. 9. The spirometer may include an indicator to show your best effort. Use the indicator as a goal to work toward during each repetition. 10. After each set of 10 deep breaths, practice coughing to be sure your lungs are clear. If you have an incision (the cut made at the time of surgery), support your incision when coughing by placing a pillow or rolled up towels firmly against it. Once you are able to get out of bed, walk around indoors and cough well. You may stop using the incentive spirometer when instructed by your caregiver.  RISKS AND COMPLICATIONS  Take your time so you do not get dizzy or light-headed.  If you are in pain, you may need to take or ask for pain medication before doing incentive spirometry. It is harder to take a deep breath if you are having pain. AFTER USE  Rest and breathe slowly and easily.  It can be helpful to keep track of a log of your progress. Your caregiver can provide you with a simple table to help with  this. If you are using the spirometer at home, follow these instructions: [place] IF:   You  are having difficultly using the spirometer.  You have trouble using the spirometer as often as instructed.  Your pain medication is not giving enough relief while using the spirometer.  You develop fever of 100.5 F (38.1 C) or higher. SEEK IMMEDIATE MEDICAL CARE IF:   You cough up bloody sputum that had not been present before.  You develop fever of 102 F (38.9 C) or greater.  You develop worsening pain at or near the incision site. MAKE SURE YOU:   Understand these instructions.  Will watch your condition.  Will get help right away if you are not doing well or get worse. Document Released: 07/01/2006 Document Revised: 05/13/2011 Document Reviewed: 09/01/2006 [employer] Patient Information 2014 [employer], [place].   ________________________________________________________________________  WHAT IS A BLOOD TRANSFUSION? Blood Transfusion Information  A transfusion is the replacement of blood or some of its parts. Blood is made up of multiple cells which provide different functions.  Red blood cells carry oxygen and are used for blood loss replacement.  White blood cells fight against infection.  Platelets control bleeding.  Plasma helps clot blood.  Other blood products are available for specialized needs, such as hemophilia or other clotting disorders. BEFORE THE TRANSFUSION  Who gives blood for transfusions?   Healthy volunteers who are fully evaluated to make sure their blood is safe. This is blood bank blood. Transfusion therapy is the safest it has ever been in the practice of medicine. Before blood is taken from a donor, a complete history is taken to make sure that person has no history of diseases nor engages in risky social behavior (examples are intravenous drug use or sexual activity with multiple partners). The donor's travel history is screened to minimize risk of transmitting infections, such as malaria. The donated blood is tested for signs of infectious  diseases, such as HIV and hepatitis. The blood is then tested to be sure it is compatible with you in order to minimize the chance of a transfusion reaction. If you or a relative donates blood, this is often done in anticipation of surgery and is not appropriate for emergency situations. It takes many days to process the donated blood. RISKS AND COMPLICATIONS Although transfusion therapy is very safe and saves many lives, the main dangers of transfusion include:   Getting an infectious disease.  Developing a transfusion reaction. This is an allergic reaction to something in the blood you were given. Every precaution is taken to prevent this. The decision to have a blood transfusion has been considered carefully by your caregiver before blood is given. Blood is not given unless the benefits outweigh the risks. AFTER THE TRANSFUSION  Right after receiving a blood transfusion, you will usually feel much better and more energetic. This is especially true if your red blood cells have gotten low (anemic). The transfusion raises the level of the red blood cells which carry oxygen, and this usually causes an energy increase.  The nurse administering the transfusion will monitor you carefully for complications. HOME CARE INSTRUCTIONS  No special instructions are needed after a transfusion. You may find your energy is better. Speak with your caregiver about any limitations on activity for underlying diseases you may have. SEEK MEDICAL CARE IF:   Your condition is not improving after your transfusion.  You develop redness or irritation at the intravenous (IV) site. SEEK IMMEDIATE MEDICAL CARE IF:  Any  of the following symptoms occur over the next 12 hours:  Shaking chills.  You have a temperature by mouth above 102 F (38.9 C), not controlled by medicine.  Chest, back, or muscle pain.  People around you feel you are not acting correctly or are confused.  Shortness of breath or difficulty  breathing.  Dizziness and fainting.  You get a rash or develop hives.  You have a decrease in urine output.  Your urine turns a dark color or changes to pink, red, or brown. Any of the following symptoms occur over the next 10 days:  You have a temperature by mouth above 102 F (38.9 C), not controlled by medicine.  Shortness of breath.  Weakness after normal activity.  The white part of the eye turns yellow (jaundice).  You have a decrease in the amount of urine or are urinating less often.  Your urine turns a dark color or changes to pink, red, or brown. Document Released: 02/16/2000 Document Revised: 05/13/2011 Document Reviewed: 10/05/2007 [facility] Patient Information 2014 [place], [place].  _______________________________________________________________________

## 2018-05-18 ENCOUNTER — Telehealth: Payer: Self-pay | Admitting: Gynecologic Oncology

## 2018-05-18 NOTE — Telephone Encounter (Signed)
Called patient to inform her that her surgery will need to be placed on home at this time per Dr. Denman George given the coronavirus outbreak. Patient disappointed. Advised she would be contacted with a new date once cleared with Dr. Denman George.  All questions answered. Advised to call for any needs if any.

## 2018-05-19 ENCOUNTER — Inpatient Hospital Stay (HOSPITAL_COMMUNITY)
Admission: RE | Admit: 2018-05-19 | Discharge: 2018-05-19 | Disposition: A | Discharge status: Home / Self Care | Payer: Medicare Other | Source: Ambulatory Visit

## 2018-05-21 ENCOUNTER — Other Ambulatory Visit (HOSPITAL_COMMUNITY): Payer: Medicare Other

## 2018-05-22 ENCOUNTER — Telehealth: Payer: Self-pay | Admitting: Gynecologic Oncology

## 2018-05-22 NOTE — Telephone Encounter (Signed)
Called and informed patient that we would need to place her surgery on hold at this time given the COVID 19 situation. Advised that she would be contacted once we were cleared to reschedule surgery cases. No concerns voiced.  Verbalizing understanding. Advised to call for any needs.

## 2018-06-17 ENCOUNTER — Ambulatory Visit: Payer: Medicare Other | Admitting: Obstetrics & Gynecology

## 2018-06-18 ENCOUNTER — Ambulatory Visit: Payer: Medicare Other | Admitting: Gynecologic Oncology

## 2018-07-01 ENCOUNTER — Telehealth: Payer: Self-pay | Admitting: Gynecologic Oncology

## 2018-07-01 NOTE — Telephone Encounter (Signed)
Called and spoke with patient to see if she would like to reschedule her surgery for May 12.  She said "it has to be done so might as well get it over with."  Advised her that if she decides to proceed, she is asked to quarantine herself starting today until surgery and that she would be testing for COVID prior to surgery. Advised that there is always the possibility that her surgery could get postponed again as it gets closer to the date.  All questions answered. After discussion, she would like to discuss with her daughter and call the office tomorrow if she would like to proceed.

## 2018-07-02 ENCOUNTER — Telehealth: Payer: Self-pay | Admitting: *Deleted

## 2018-07-02 NOTE — Telephone Encounter (Signed)
Patient called and left a message that she wants 5/14 for her surgery date. Message forwarded to [facility] APP

## 2018-07-07 ENCOUNTER — Ambulatory Visit (HOSPITAL_COMMUNITY): Admission: RE | Admit: 2018-07-07 | Payer: Medicare Other | Source: Home / Self Care | Admitting: Gynecologic Oncology

## 2018-07-07 ENCOUNTER — Telehealth: Payer: Self-pay | Admitting: Gynecologic Oncology

## 2018-07-07 ENCOUNTER — Other Ambulatory Visit: Payer: Self-pay | Admitting: Gynecologic Oncology

## 2018-07-07 ENCOUNTER — Encounter (HOSPITAL_COMMUNITY): Admission: RE | Payer: Self-pay | Source: Home / Self Care

## 2018-07-07 DIAGNOSIS — R1900 Intra-abdominal and pelvic swelling, mass and lump, unspecified site: Secondary | ICD-10-CM

## 2018-07-07 SURGERY — HYSTERECTOMY, TOTAL, ROBOT-ASSISTED, LAPAROSCOPIC, WITH BILATERAL SALPINGO-OOPHORECTOMY
Anesthesia: General

## 2018-07-07 NOTE — Telephone Encounter (Signed)
Called patient to inform her that we are still waiting to hear if we will be able to perform OR cases the weke of May 11. Advised her that we would contact her when we had additional information. If cleared to proceed, she would like to have surgery on May 14. No concerns voiced. Advised to call for any needs.

## 2018-07-09 NOTE — Patient Instructions (Addendum)
Dana Gregory  07/09/2018      Your procedure is scheduled on 07/16/2018   Report to [place]  at    [place].  Call this number if you have problems the morning of surgery:[phone]  OUR ADDRESS IS [place], WE ARE LOCATED [facility] WITH [facility].   Remember:  Do not eat food or drink liquids after midnight. EAt a light diet the day before surgery.  Examples include soups, broths, toast, yogurt and mashed potatoes.  Things to avoid include carbonated beverages , raw fruits and vegetables and beans, You may have clear liquids from 12 midnite until 0430am nite before surgery.                 CLEAR LIQUID DIET   Foods Allowed                                                                     Foods Excluded  Coffee and tea, regular and decaf                             liquids that you cannot  Plain Jell-O in any flavor                                             see through such as: Fruit ices (not with fruit pulp)                                     milk, soups, orange juice  Iced Popsicles                                    All solid food                                     Cranberry, grape and apple juices Sports drinks like Gatorade Lightly seasoned clear broth or consume(fat free) Sugar, honey syrup  Sample Menu Breakfast                                Lunch                                     Supper Cranberry juice                    Beef broth                            Chicken broth Jell-O  Grape juice                           Apple juice Coffee or tea                        Jell-O                                      Popsicle                                                Coffee or tea                        Coffee or tea  _____________________________________________________________________                   Take these medicines the morning of surgery with A SIP OF WATER:  amlodipine, eye drops as usual    Do not wear jewelry, make-up or nail polish.  Do not wear lotions, powders, or perfumes, or deoderant.  Do not shave 48 hours prior to surgery.  Men may shave face and neck.  Do not bring valuables to the hospital.  [facility] is not responsible for any belongings or valuables.  Contacts, dentures or bridgework may not be worn into surgery.  Leave your suitcase in the car.  After surgery it may be brought to your room.  For patients admitted to the hospital, discharge time will be determined by your treatment team.  Patients discharged the day of surgery will not be allowed to drive home.   Special instructions:  Coughing and deep breathing exercises, leg exercises   Please read over the following fact sheets that you were given:       [facility] - Preparing for Surgery Before surgery, you can play an important role.  Because skin is not sterile, your skin needs to be as free of germs as possible.  You can reduce the number of germs on your skin by washing with CHG (chlorahexidine gluconate) soap before surgery.  CHG is an antiseptic cleaner which kills germs and bonds with the skin to continue killing germs even after washing. Please DO NOT use if you have an allergy to CHG or antibacterial soaps.  If your skin becomes reddened/irritated stop using the CHG and inform your nurse when you arrive at Short Stay. Do not shave (including legs and underarms) for at least 48 hours prior to the first CHG shower.  You may shave your face/neck. Please follow these instructions carefully:  1.  Shower with CHG Soap the night before surgery and the  morning of Surgery.  2.  If you choose to wash your hair, wash your hair first as usual with your  normal  shampoo.  3.  After you shampoo, rinse your hair and body thoroughly to remove the  shampoo.                           4.  Use CHG as you would any other liquid soap.  You can apply chg directly  to the skin and  wash  Gently with a scrungie or clean washcloth.  5.  Apply the CHG Soap to your body ONLY FROM THE NECK DOWN.   Do not use on face/ open                           Wound or open sores. Avoid contact with eyes, ears mouth and genitals (private parts).                       Wash face,  Genitals (private parts) with your normal soap.             6.  Wash thoroughly, paying special attention to the area where your surgery  will be performed.  7.  Thoroughly rinse your body with warm water from the neck down.  8.  DO NOT shower/wash with your normal soap after using and rinsing off  the CHG Soap.                9.  Pat yourself dry with a clean towel.            10.  Wear clean pajamas.            11.  Place clean sheets on your bed the night of your first shower and do not  sleep with pets. Day of Surgery : Do not apply any lotions/deodorants the morning of surgery.  Please wear clean clothes to the hospital/surgery center.  FAILURE TO FOLLOW THESE INSTRUCTIONS MAY RESULT IN THE CANCELLATION OF YOUR SURGERY PATIENT SIGNATURE_________________________________  NURSE SIGNATURE__________________________________  ________________________________________________________________________  WHAT IS A BLOOD TRANSFUSION? Blood Transfusion Information  A transfusion is the replacement of blood or some of its parts. Blood is made up of multiple cells which provide different functions.  Red blood cells carry oxygen and are used for blood loss replacement.  White blood cells fight against infection.  Platelets control bleeding.  Plasma helps clot blood.  Other blood products are available for specialized needs, such as hemophilia or other clotting disorders. BEFORE THE TRANSFUSION  Who gives blood for transfusions?   Healthy volunteers who are fully evaluated to make sure their blood is safe. This is blood bank blood. Transfusion therapy is the safest it has ever been in the  practice of medicine. Before blood is taken from a donor, a complete history is taken to make sure that person has no history of diseases nor engages in risky social behavior (examples are intravenous drug use or sexual activity with multiple partners). The donor's travel history is screened to minimize risk of transmitting infections, such as malaria. The donated blood is tested for signs of infectious diseases, such as HIV and hepatitis. The blood is then tested to be sure it is compatible with you in order to minimize the chance of a transfusion reaction. If you or a relative donates blood, this is often done in anticipation of surgery and is not appropriate for emergency situations. It takes many days to process the donated blood. RISKS AND COMPLICATIONS Although transfusion therapy is very safe and saves many lives, the main dangers of transfusion include:   Getting an infectious disease.  Developing a transfusion reaction. This is an allergic reaction to something in the blood you were given. Every precaution is taken to prevent this. The decision to have a blood transfusion has been considered carefully by your caregiver before blood is given. Blood is not given unless the benefits outweigh  the risks. AFTER THE TRANSFUSION  Right after receiving a blood transfusion, you will usually feel much better and more energetic. This is especially true if your red blood cells have gotten low (anemic). The transfusion raises the level of the red blood cells which carry oxygen, and this usually causes an energy increase.  The nurse administering the transfusion will monitor you carefully for complications. HOME CARE INSTRUCTIONS  No special instructions are needed after a transfusion. You may find your energy is better. Speak with your caregiver about any limitations on activity for underlying diseases you may have. SEEK MEDICAL CARE IF:   Your condition is not improving after your transfusion.  You  develop redness or irritation at the intravenous (IV) site. SEEK IMMEDIATE MEDICAL CARE IF:  Any of the following symptoms occur over the next 12 hours:  Shaking chills.  You have a temperature by mouth above 102 F (38.9 C), not controlled by medicine.  Chest, back, or muscle pain.  People around you feel you are not acting correctly or are confused.  Shortness of breath or difficulty breathing.  Dizziness and fainting.  You get a rash or develop hives.  You have a decrease in urine output.  Your urine turns a dark color or changes to pink, red, or brown. Any of the following symptoms occur over the next 10 days:  You have a temperature by mouth above 102 F (38.9 C), not controlled by medicine.  Shortness of breath.  Weakness after normal activity.  The white part of the eye turns yellow (jaundice).  You have a decrease in the amount of urine or are urinating less often.  Your urine turns a dark color or changes to pink, red, or brown. Document Released: 02/16/2000 Document Revised: 05/13/2011 Document Reviewed: 10/05/2007 ExitCare Patient Information 2014 [place].  _______________________________________________________________________  Incentive Spirometer  An incentive spirometer is a tool that can help keep your lungs clear and active. This tool measures how well you are filling your lungs with each breath. Taking long deep breaths may help reverse or decrease the chance of developing breathing (pulmonary) problems (especially infection) following:  A long period of time when you are unable to move or be active. BEFORE THE PROCEDURE   If the spirometer includes an indicator to show your best effort, your nurse or respiratory therapist will set it to a desired goal.  If possible, sit up straight or lean slightly forward. Try not to slouch.  Hold the incentive spirometer in an upright position. INSTRUCTIONS FOR USE  1. Sit on the edge of your bed if  possible, or sit up as far as you can in bed or on a chair. 2. Hold the incentive spirometer in an upright position. 3. Breathe out normally. 4. Place the mouthpiece in your mouth and seal your lips tightly around it. 5. Breathe in slowly and as deeply as possible, raising the piston or the ball toward the top of the column. 6. Hold your breath for 3-5 seconds or for as long as possible. Allow the piston or ball to fall to the bottom of the column. 7. Remove the mouthpiece from your mouth and breathe out normally. 8. Rest for a few seconds and repeat Steps 1 through 7 at least 10 times every 1-2 hours when you are awake. Take your time and take a few normal breaths between deep breaths. 9. The spirometer may include an indicator to show your best effort. Use the indicator as a goal to work  toward during each repetition. 10. After each set of 10 deep breaths, practice coughing to be sure your lungs are clear. If you have an incision (the cut made at the time of surgery), support your incision when coughing by placing a pillow or rolled up towels firmly against it. Once you are able to get out of bed, walk around indoors and cough well. You may stop using the incentive spirometer when instructed by your caregiver.  RISKS AND COMPLICATIONS  Take your time so you do not get dizzy or light-headed.  If you are in pain, you may need to take or ask for pain medication before doing incentive spirometry. It is harder to take a deep breath if you are having pain. AFTER USE  Rest and breathe slowly and easily.  It can be helpful to keep track of a log of your progress. Your caregiver can provide you with a simple table to help with this. If you are using the spirometer at home, follow these instructions: [place] IF:   You are having difficultly using the spirometer.  You have trouble using the spirometer as often as instructed.  Your pain medication is not giving enough relief while using  the spirometer.  You develop fever of 100.5 F (38.1 C) or higher. SEEK IMMEDIATE MEDICAL CARE IF:   You cough up bloody sputum that had not been present before.  You develop fever of 102 F (38.9 C) or greater.  You develop worsening pain at or near the incision site. MAKE SURE YOU:   Understand these instructions.  Will watch your condition.  Will get help right away if you are not doing well or get worse. Document Released: 07/01/2006 Document Revised: 05/13/2011 Document Reviewed: 09/01/2006 [facility] Patient Information 2014 [place], [place].   ________________________________________________________________________

## 2018-07-10 ENCOUNTER — Other Ambulatory Visit: Payer: Self-pay

## 2018-07-10 ENCOUNTER — Encounter (HOSPITAL_COMMUNITY): Payer: Self-pay

## 2018-07-10 ENCOUNTER — Encounter (HOSPITAL_COMMUNITY)
Admission: RE | Admit: 2018-07-10 | Discharge: 2018-07-10 | Disposition: A | Discharge status: Home / Self Care | Payer: Medicare Other | Source: Ambulatory Visit | Attending: Gynecologic Oncology | Admitting: Gynecologic Oncology

## 2018-07-10 DIAGNOSIS — Z01818 Encounter for other preprocedural examination: Secondary | ICD-10-CM | POA: Insufficient documentation

## 2018-07-10 DIAGNOSIS — I10 Essential (primary) hypertension: Secondary | ICD-10-CM | POA: Diagnosis not present

## 2018-07-10 DIAGNOSIS — E785 Hyperlipidemia, unspecified: Secondary | ICD-10-CM | POA: Diagnosis not present

## 2018-07-10 DIAGNOSIS — Z1159 Encounter for screening for other viral diseases: Secondary | ICD-10-CM | POA: Insufficient documentation

## 2018-07-10 DIAGNOSIS — N839 Noninflammatory disorder of ovary, fallopian tube and broad ligament, unspecified: Secondary | ICD-10-CM | POA: Diagnosis not present

## 2018-07-10 DIAGNOSIS — Z79899 Other long term (current) drug therapy: Secondary | ICD-10-CM | POA: Insufficient documentation

## 2018-07-10 DIAGNOSIS — R9431 Abnormal electrocardiogram [ECG] [EKG]: Secondary | ICD-10-CM | POA: Insufficient documentation

## 2018-07-10 LAB — ABO/RH: ABO/RH(D): O POS

## 2018-07-10 LAB — CBC
HCT: 46.4 % — ABNORMAL HIGH (ref 36.0–46.0)
Hemoglobin: 15.1 g/dL — ABNORMAL HIGH (ref 12.0–15.0)
MCH: 28.9 pg (ref 26.0–34.0)
MCHC: 32.5 g/dL (ref 30.0–36.0)
MCV: 88.7 fL (ref 80.0–100.0)
Platelets: 346 10*3/uL (ref 150–400)
RBC: 5.23 MIL/uL — ABNORMAL HIGH (ref 3.87–5.11)
RDW: 13.3 % (ref 11.5–15.5)
WBC: 7.1 10*3/uL (ref 4.0–10.5)
nRBC: 0 % (ref 0.0–0.2)

## 2018-07-10 LAB — COMPREHENSIVE METABOLIC PANEL
ALT: 15 U/L (ref 0–44)
AST: 19 U/L (ref 15–41)
Albumin: 3.9 g/dL (ref 3.5–5.0)
Alkaline Phosphatase: 70 U/L (ref 38–126)
Anion gap: 9 (ref 5–15)
BUN: 12 mg/dL (ref 8–23)
CO2: 25 mmol/L (ref 22–32)
Calcium: 9.1 mg/dL (ref 8.9–10.3)
Chloride: 105 mmol/L (ref 98–111)
Creatinine, Ser: 0.81 mg/dL (ref 0.44–1.00)
GFR calc Af Amer: >60 mL/min (ref >60)
GFR calc non Af Amer: >60 mL/min (ref >60)
Glucose, Bld: 106 mg/dL — ABNORMAL HIGH (ref 70–99)
Potassium: 4.2 mmol/L (ref 3.5–5.1)
Sodium: 139 mmol/L (ref 135–145)
Total Bilirubin: 0.7 mg/dL (ref 0.3–1.2)
Total Protein: 7.2 g/dL (ref 6.5–8.1)

## 2018-07-10 LAB — URINALYSIS, ROUTINE W REFLEX MICROSCOPIC
Bacteria, UA: NONE SEEN
Bilirubin Urine: NEGATIVE
Glucose, UA: NEGATIVE mg/dL
Ketones, ur: NEGATIVE mg/dL
Leukocytes,Ua: NEGATIVE
Nitrite: NEGATIVE
Protein, ur: NEGATIVE mg/dL
Specific Gravity, Urine: 1.002 — ABNORMAL LOW (ref 1.005–1.030)
pH: 6 (ref 5.0–8.0)

## 2018-07-10 NOTE — Progress Notes (Signed)
SPOKE W/  _Patient     SCREENING SYMPTOMS OF COVID 19:   COUGH--no  RUNNY NOSE--- no  SORE THROAT---no  NASAL CONGESTION----no  SNEEZING----no  SHORTNESS OF BREATH---no  DIFFICULTY BREATHING---no  TEMP >100.0 -----no  UNEXPLAINED BODY ACHES------no  CHILLS -------- no  HEADACHES ---------no  LOSS OF SMELL/ TASTE --------no    HAVE YOU OR ANY FAMILY MEMBER TRAVELLED PAST 14 DAYS OUT OF THE   COUNTY---no STATE----no COUNTRY----no  HAVE YOU OR ANY FAMILY MEMBER BEEN EXPOSED TO ANYONE WITH COVID 19? no    

## 2018-07-10 NOTE — Progress Notes (Signed)
Patient given map and instructions to haveCOVID 19 testing done on 07/13/2018 between the hoursof 8-4.

## 2018-07-10 NOTE — Progress Notes (Signed)
Chart given to [facility] for reivew.   Please note ekg .  Hx of hypertension.  No other EKG available.  Attempted to obtain ekg from [facility].  No EKG available at [facility].

## 2018-07-10 NOTE — Progress Notes (Signed)
Patient stated last ekg done at [place].  Called [facility] and ekgs are not done at [facility].

## 2018-07-13 ENCOUNTER — Encounter: Payer: Self-pay | Admitting: Oncology

## 2018-07-13 ENCOUNTER — Other Ambulatory Visit: Payer: Self-pay

## 2018-07-13 ENCOUNTER — Telehealth: Payer: Self-pay

## 2018-07-13 ENCOUNTER — Telehealth: Payer: Self-pay | Admitting: Oncology

## 2018-07-13 ENCOUNTER — Telehealth: Payer: Self-pay | Admitting: Internal Medicine

## 2018-07-13 ENCOUNTER — Other Ambulatory Visit (HOSPITAL_COMMUNITY)
Admission: RE | Admit: 2018-07-13 | Discharge: 2018-07-13 | Disposition: A | Discharge status: Home / Self Care | Payer: Medicare Other | Source: Ambulatory Visit | Attending: Gynecologic Oncology | Admitting: Gynecologic Oncology

## 2018-07-13 DIAGNOSIS — R1900 Intra-abdominal and pelvic swelling, mass and lump, unspecified site: Secondary | ICD-10-CM

## 2018-07-13 DIAGNOSIS — Z01818 Encounter for other preprocedural examination: Secondary | ICD-10-CM | POA: Diagnosis not present

## 2018-07-13 NOTE — Telephone Encounter (Signed)
Dana Gregory called back and was advised of telephone appointment with Dr. Debara Pickett tomorrow at 8:30 am.  Also discussed that she will need an echocardiogram before surgery on 5/14 so she will be receiving a call to schedule it as well.

## 2018-07-13 NOTE — Telephone Encounter (Signed)
Left a message for [facility] with telephone appointment with Dr. Debara Pickett tomorrow at 8:30 am.  Requested a return call.

## 2018-07-13 NOTE — Telephone Encounter (Signed)
Smartphone/consent/ my chart/ pre reg completed °

## 2018-07-13 NOTE — Telephone Encounter (Signed)
New Message     Santiago Glad is calling to schedule a new patient appt and to schedule an echo for the pt Santiago Glad needs the echo to be scheduled before 05/14   Please call

## 2018-07-13 NOTE — Telephone Encounter (Signed)
Dana Gregory wanted to know if Ms Bosch is going to be going home same day or be a 23 hour obs as they need to know to staff the observation room, Laural Roes that Dr. Denman George has recently been sending patient home the same day. Note 05-13-18 Dr. Denman George states that it would be an outpatient procedure. Will give this note for [name] to review 07-14-18 and call Dana Gregory back tomorrow. Dana Gregory can be reached at [phone].

## 2018-07-13 NOTE — Progress Notes (Addendum)
Anesthesia Chart Review   Case:  [id number] Date/Time:  07/16/18 0700   Procedure:  XI ROBOTIC ASSISTED TOTAL HYSTERECTOMY WITH BILATERAL SALPINGO OOPHORECTOMY with possible staging (N/A )   Anesthesia type:  General   Pre-op diagnosis:  left ovarian mass   Location:  [facility] OR ROOM .5 / [place]   Surgeon:  Everitt Amber, MD      DISCUSSION: 73 yo never smoker with h/o HTN, HLD, diverticulitis, left ovarian mass scheduled for above procedure 07/16/18 with Dr. Everitt Amber.   EKG with LBBB at PAT appt. 07/16/18.  No previous EKG to compare to.  Contacted PCP, no EKG on chart.  Pt is asymptomatic.  Discussed with Dr. Gifford Shave.  Pt will require cardiac input and echo prior to proceeding.  Discussed with Joylene John, NP.   COVID19 swab completed 07/13/18, pending results.   Addendum 07/14/2018:  Pt seen by cardiologist, Dr. Lyman Bishop, via telemedicine today 07/14/18. Per his note, "Dana Gregory has an abnormal EKG demonstrating an interventricular conduction delay.  This is an incomplete left bundle pattern as the QRS is less than 120 ms.  I doubt it represents ischemia but it is abnormal and compared to an older EKG, different.  She also has history of hypertension and dyslipidemia as well as a family history of premature coronary disease.  She did have a chest discomfort episode last month which lasted for several days and does sound somewhat atypical for heart disease however I think further work-up is reasonable."   Echo is scheduled for tomorrow.   Addendum 07/15/2018:  No official read on Echo performed today.  Discussed with Dr. Debara Pickett.  He states that LV function normal, ok to proceed.   VS: BP (!) 149/80 (BP Location: Right Arm)   Pulse 97   Temp 37 C (Oral)   Resp 18   Ht 5\' 3"  (1.6 m)   Wt 98 kg   SpO2 95%   BMI 38.26 kg/m   PROVIDERS: Bartholome Bill, MD is PCP    LABS: Labs reviewed: Acceptable for surgery. (all labs ordered are listed, but only abnormal  results are displayed)  Labs Reviewed  CBC - Abnormal; Notable for the following components:      Result Value   RBC 5.23 (*)    Hemoglobin 15.1 (*)    HCT 46.4 (*)    All other components within normal limits  COMPREHENSIVE METABOLIC PANEL - Abnormal; Notable for the following components:   Glucose, Bld 106 (*)    All other components within normal limits  URINALYSIS, ROUTINE W REFLEX MICROSCOPIC - Abnormal; Notable for the following components:   Color, Urine STRAW (*)    Specific Gravity, Urine 1.002 (*)    Hgb urine dipstick SMALL (*)    All other components within normal limits  TYPE AND SCREEN  ABO/RH     IMAGES:   EKG: 07/10/2018 Rate 88 bpm Normal sinus rhythm Cannot rule out Anterior infarct , age undetermined ST & T wave abnormality, consider lateral ischemia Abnormal ECG Compared to previous tracing a left bundle QRS is noted anterolaterally which is new  CV: Echo 07/15/2018 IMPRESSIONS    1. The left ventricle has a visually estimated ejection fraction of 50%. Septal-lateral dyssynchrony consistent with LBBB. The cavity size was normal. Left ventricular diastolic Doppler parameters are consistent with impaired relaxation.  2. The right ventricle has normal systolic function. The cavity was normal. There is no increase in right ventricular wall thickness.  3. The aortic valve is tricuspid. No stenosis of the aortic valve.  4. The aortic root is normal in size and structure.  5. No evidence of mitral valve stenosis. No significant mitral regurgitation.  6. IVC normal in size. No complete TR doppler jet so unable to estimate PA systolic pressure. Past Medical History:  Diagnosis Date  . Diverticulitis   . Fibroid   . Hyperlipidemia   . Hypertension     Past Surgical History:  Procedure Laterality Date  . FINGER SURGERY    . FOOT SURGERY    . REDUCTION MAMMAPLASTY Bilateral     MEDICATIONS: . amLODipine (NORVASC) 5 MG tablet  .  Carboxymethylcellulose Sodium (ARTIFICIAL TEARS OP)  . ibuprofen (ADVIL,MOTRIN) 600 MG tablet  . latanoprost (XALATAN) 0.005 % ophthalmic solution  . pravastatin (PRAVACHOL) 40 MG tablet  . senna-docusate (SENOKOT-S) 8.6-50 MG tablet  . traMADol (ULTRAM) 50 MG tablet   No current facility-administered medications for this encounter.      Maia Plan [facility] Pre-Surgical Testing [phone] 07/14/18 9:42 AM

## 2018-07-14 ENCOUNTER — Telehealth (INDEPENDENT_AMBULATORY_CARE_PROVIDER_SITE_OTHER): Payer: Medicare Other | Admitting: Internal Medicine

## 2018-07-14 ENCOUNTER — Telehealth: Payer: Self-pay | Admitting: Oncology

## 2018-07-14 ENCOUNTER — Encounter: Payer: Self-pay | Admitting: Internal Medicine

## 2018-07-14 VITALS — BP 144/91 | HR 77 | Ht 63.0 in | Wt 210.0 lb

## 2018-07-14 DIAGNOSIS — I10 Essential (primary) hypertension: Secondary | ICD-10-CM | POA: Diagnosis not present

## 2018-07-14 DIAGNOSIS — E782 Mixed hyperlipidemia: Secondary | ICD-10-CM

## 2018-07-14 DIAGNOSIS — I454 Nonspecific intraventricular block: Secondary | ICD-10-CM | POA: Insufficient documentation

## 2018-07-14 DIAGNOSIS — Z01810 Encounter for preprocedural cardiovascular examination: Secondary | ICD-10-CM

## 2018-07-14 DIAGNOSIS — Z7189 Other specified counseling: Secondary | ICD-10-CM

## 2018-07-14 DIAGNOSIS — E785 Hyperlipidemia, unspecified: Secondary | ICD-10-CM | POA: Insufficient documentation

## 2018-07-14 DIAGNOSIS — R1900 Intra-abdominal and pelvic swelling, mass and lump, unspecified site: Secondary | ICD-10-CM | POA: Insufficient documentation

## 2018-07-14 LAB — NOVEL CORONAVIRUS, NAA (HOSP ORDER, SEND-OUT TO REF LAB; TAT 18-24 HRS): SARS-CoV-2, NAA: NOT DETECTED

## 2018-07-14 NOTE — Patient Instructions (Addendum)
Medication Instructions:  Continue current medications If you need a refill on your cardiac medications before your next appointment, please call your pharmacy.   Lab work: None needed If you have labs (blood work) drawn today and your tests are completely normal, you will receive your results only by: Marland Kitchen MyChart Message (if you have MyChart) OR . A paper copy in the mail If you have any lab test that is abnormal or we need to change your treatment, we will call you to review the results.  Testing/Procedures: Echocardiogram 07/15/2018 @ [facility] Please go to main entrance off [address] will go to the admitting department Your appointment is at 10am Please arrive around 9:30am to be checked in  Follow-Up: At [facility], you and your health needs are our priority.  As part of our continuing mission to provide you with exceptional heart care, we have created designated Provider Care Teams.  These Care Teams include your primary Cardiologist (physician) and Advanced Practice Providers (APPs -  Physician Assistants and Nurse Practitioners) who all work together to provide you with the care you need, when you need it. You will need a follow up appointment in 1 months. You may see Dr. Debara Pickett or one of the following Advanced Practice Providers on your designated Care Team: Almyra Deforest, [place] . Fabian Sharp, PA-C  Please call our office to schedule this appointment after surgery   Any Other Special Instructions Will Be Listed Below (If Applicable).

## 2018-07-14 NOTE — Progress Notes (Signed)
Virtual Visit via Video Note   This visit type was conducted due to national recommendations for restrictions regarding the COVID-19 Pandemic (e.g. social distancing) in an effort to limit this patient's exposure and mitigate transmission in our community.  Due to her co-morbid illnesses, this patient is at least at moderate risk for complications without adequate follow up.  This format is felt to be most appropriate for this patient at this time.  All issues noted in this document were discussed and addressed.  A limited physical exam was performed with this format.  Please refer to the patient's chart for her consent to telehealth for [facility].   Evaluation Performed:  Doximity video visit  Date:  07/14/2018   ID:  Dana Gregory, DOB [birth year], MRN [id number]  Patient Location:  [address] [place] [address]  Provider location:   [address], [place], [place] [address]  PCP:  Bartholome Bill, MD  Cardiologist:  No primary care provider on file. Electrophysiologist:  None   Chief Complaint:  Preoperative cardiac exam  History of Present Illness:    Dana Gregory is a 73 y.o. female who presents via audio/video conferencing for a telehealth visit today.  Dana Gregory is a pleasant 73 year old female who is kindly referred to heart care for evaluation of preoperative risk.  She recently underwent an EKG and anesthesia assessment prior to a planned surgery for a complex left ovarian cyst.  She was noted to have an intraventricular conduction delay with left bundle pattern (less than 120 ms, therefore not a left bundle branch block), which prompted cardiovascular evaluation.  In addition, there is family history of heart disease including her mother who had a heart attack in her 29s and died of heart failure in father who was quite old but died of heart attack at 35.  Currently she reports being asymptomatic, denying chest pain or worsening shortness  of breath.  She did have an extended episode of what she felt like was reflux about a month ago.  It did not seem to respond to reflux medications and was a consistent, dull chest discomfort that lasted for couple days and eventually subsided.  She has subsequently not had any difficulty with exercise, housework, walking upstairs or other physical activities.  She does have a history of hypertension and dyslipidemia on treatment and is obese.  Lab work in 2018 showed LDL of 87.  Hemoglobin A1c in January 2020 was 6.2.  The patient does not have symptoms concerning for COVID-19 infection (fever, chills, cough, or new SHORTNESS OF BREATH).    Prior CV studies:   The following studies were reviewed today:  Chart review Lab work  PMHx:  Past Medical History:  Diagnosis Date   Diverticulitis    Fibroid    Hyperlipidemia    Hypertension     Past Surgical History:  Procedure Laterality Date   FINGER SURGERY     FOOT SURGERY     REDUCTION MAMMAPLASTY Bilateral     FAMHx:  Family History  Problem Relation Age of Onset   Heart disease Mother    Hypertension Mother    Breast cancer Neg Hx     SOCHx:   reports that she has never smoked. She has never used smokeless tobacco. She reports that she does not drink alcohol or use drugs.  ALLERGIES:  No Known Allergies  MEDS:  Current Meds  Medication Sig   amLODipine (NORVASC) 5 MG tablet Take 5  mg by mouth daily.   Carboxymethylcellulose Sodium (ARTIFICIAL TEARS OP) Place 1 drop into both eyes daily as needed (dry eyes).   ibuprofen (ADVIL,MOTRIN) 600 MG tablet Take 1 tablet (600 mg total) by mouth every 6 (six) hours as needed for moderate pain. For AFTER surgery   latanoprost (XALATAN) 0.005 % ophthalmic solution Place 1 drop into the right eye at bedtime.   pravastatin (PRAVACHOL) 40 MG tablet Take 40 mg by mouth every evening.    senna-docusate (SENOKOT-S) 8.6-50 MG tablet Take 2 tablets by mouth at bedtime. For  AFTER surgery, do not take if having loose stools   traMADol (ULTRAM) 50 MG tablet Take 1 tablet (50 mg total) by mouth every 6 (six) hours as needed for moderate pain. For AFTER surgery, do not take and drive     ROS: Pertinent items noted in HPI and remainder of comprehensive ROS otherwise negative.  Labs/Other Tests and Data Reviewed:    Recent Labs: 07/10/2018: ALT 15; BUN 12; Creatinine, Ser 0.81; Hemoglobin 15.1; Platelets 346; Potassium 4.2; Sodium 139   Recent Lipid Panel No results found for: CHOL, TRIG, HDL, CHOLHDL, LDLCALC, LDLDIRECT  Wt Readings from Last 3 Encounters:  07/14/18 210 lb (95.3 kg)  07/10/18 216 lb (98 kg)  05/13/18 213 lb (96.6 kg)     Exam:    Vital Signs:  BP (!) 144/91    Pulse 77    Ht 5\' 3"  (1.6 m)    Wt 210 lb (95.3 kg)    BMI 37.20 kg/m    General appearance: alert, no distress and moderately obese Lungs: No audible wheezing or visual respiratory difficulty Abdomen: obese Extremities: extremities normal, atraumatic, no cyanosis or edema Skin: normal skin color Neurologic: Mental status: Alert, oriented, thought content appropriate Psych: Pleasant  ASSESSMENT & PLAN:    1. Abnormal EKG - IVCD, incomplete LBBB 2. Indeterminate pre-operative risk for L ovarian mass resection 3. HTN 4. HLD 5. Family history of premature CAD  Dana Gregory has an abnormal EKG demonstrating an interventricular conduction delay.  This is an incomplete left bundle pattern as the QRS is less than 120 ms.  I doubt it represents ischemia but it is abnormal and compared to an older EKG, different.  She also has history of hypertension and dyslipidemia as well as a family history of premature coronary disease.  She did have a chest discomfort episode last month which lasted for several days and does sound somewhat atypical for heart disease however I think further work-up is reasonable.  She has an echocardiogram scheduled for tomorrow.  I will review this and if there are  no new wall motion abnormalities and normal heart function, I would suggest proceeding to surgery.  I would like to see her back for follow-up with an EKG in the office in about a month.  The echo will also serve as a baseline in worst case if she would need adjuvant chemotherapy that might be cardiotoxic.  Thanks for the kind referral.  COVID-19 Education: The signs and symptoms of COVID-19 were discussed with the patient and how to seek care for testing (follow up with PCP or arrange E-visit).  The importance of social distancing was discussed today.  Patient Risk:   After full review of this patients clinical status, I feel that they are at least moderate risk at this time.  Time:   Today, I have spent 25 minutes with the patient with telehealth technology discussing preoperative risk, abnormal EKG, hypertension, family history  of coronary disease.     Medication Adjustments/Labs and Tests Ordered: Current medicines are reviewed at length with the patient today.  Concerns regarding medicines are outlined above.   Tests Ordered: No orders of the defined types were placed in this encounter.   Medication Changes: No orders of the defined types were placed in this encounter.   Disposition:  in 1 month(s)  Pixie Casino, MD, [facility] [place] Director of the Advanced Lipid Disorders &  Cardiovascular Risk Reduction Clinic Diplomate of the American Board of Clinical Lipidology Attending Cardiologist  Direct Dial: [phone]   Fax: [phone]  Website:  [email or web address]  Pixie Casino, MD  07/14/2018 9:06 AM

## 2018-07-14 NOTE — Telephone Encounter (Signed)
Left a message for [facility] with appointment for echo at [facility] tomorrow at 10 am.  Advised her to go in the main entrance and check in at admitting.

## 2018-07-14 NOTE — Telephone Encounter (Signed)
[facility] and verified that she is aware of echo appointment and where to check in.  She verbalized understanding and agreement.

## 2018-07-15 ENCOUNTER — Telehealth: Payer: Self-pay | Admitting: Oncology

## 2018-07-15 ENCOUNTER — Other Ambulatory Visit: Payer: Self-pay

## 2018-07-15 ENCOUNTER — Ambulatory Visit (HOSPITAL_COMMUNITY)
Admission: RE | Admit: 2018-07-15 | Discharge: 2018-07-15 | Disposition: A | Discharge status: Home / Self Care | Payer: Medicare Other | Source: Ambulatory Visit | Attending: Gynecologic Oncology | Admitting: Gynecologic Oncology

## 2018-07-15 DIAGNOSIS — R1900 Intra-abdominal and pelvic swelling, mass and lump, unspecified site: Secondary | ICD-10-CM | POA: Diagnosis present

## 2018-07-15 DIAGNOSIS — I10 Essential (primary) hypertension: Secondary | ICD-10-CM | POA: Diagnosis not present

## 2018-07-15 DIAGNOSIS — E785 Hyperlipidemia, unspecified: Secondary | ICD-10-CM | POA: Insufficient documentation

## 2018-07-15 DIAGNOSIS — Z8249 Family history of ischemic heart disease and other diseases of the circulatory system: Secondary | ICD-10-CM | POA: Diagnosis not present

## 2018-07-15 DIAGNOSIS — R9431 Abnormal electrocardiogram [ECG] [EKG]: Secondary | ICD-10-CM | POA: Insufficient documentation

## 2018-07-15 NOTE — Telephone Encounter (Addendum)
Called Dana Gregory back and told her that it is OK to proceed with surgery per Dr. Milus Height, PA-C. She verbalized agreement.

## 2018-07-15 NOTE — Telephone Encounter (Signed)
Requested stat read of [facility].

## 2018-07-15 NOTE — Progress Notes (Signed)
SPOKE W/  [place]     SCREENING SYMPTOMS OF COVID 19:   COUGH no  RUNNY NOSE no  SORE THROAT no  NASAL CONGESTION no  SNEEZING no  SHORTNESS OF BREATH no  DIFFICULTY BREATHING no  TEMP >100.0  no  UNEXPLAINED BODY ACHES no  CHILLS no  HEADACHES no  LOSS OF SMELL/ TASTE no    HAVE YOU OR ANY FAMILY MEMBER TRAVELLED PAST 14 DAYS OUT OF THE   COUNTY no STATE no COUNTRY no  HAVE YOU OR ANY FAMILY MEMBER BEEN EXPOSED TO ANYONE WITH COVID 19? no

## 2018-07-15 NOTE — Progress Notes (Signed)
  Echocardiogram 2D Echocardiogram has been performed.  Darlina Sicilian M 07/15/2018, 10:40 AM

## 2018-07-15 NOTE — Telephone Encounter (Signed)
Called [facility] and advised her to plan on surgery tomorrow unless she hears from [place] this afternoon regarding her echo results.  She verbalized understanding.

## 2018-07-16 ENCOUNTER — Encounter (HOSPITAL_BASED_OUTPATIENT_CLINIC_OR_DEPARTMENT_OTHER)
Admission: RE | Disposition: A | Discharge status: Home / Self Care | Payer: Self-pay | Source: Home / Self Care | Attending: Gynecologic Oncology

## 2018-07-16 ENCOUNTER — Ambulatory Visit (HOSPITAL_BASED_OUTPATIENT_CLINIC_OR_DEPARTMENT_OTHER): Payer: Medicare Other | Admitting: Physician Assistant

## 2018-07-16 ENCOUNTER — Ambulatory Visit (HOSPITAL_COMMUNITY)
Admission: RE | Admit: 2018-07-16 | Discharge: 2018-07-16 | Disposition: A | Discharge status: Home / Self Care | Payer: Medicare Other | Attending: Gynecologic Oncology | Admitting: Gynecologic Oncology

## 2018-07-16 ENCOUNTER — Ambulatory Visit (HOSPITAL_BASED_OUTPATIENT_CLINIC_OR_DEPARTMENT_OTHER): Payer: Medicare Other | Admitting: Anesthesiology

## 2018-07-16 ENCOUNTER — Encounter (HOSPITAL_BASED_OUTPATIENT_CLINIC_OR_DEPARTMENT_OTHER): Payer: Self-pay

## 2018-07-16 DIAGNOSIS — E785 Hyperlipidemia, unspecified: Secondary | ICD-10-CM | POA: Insufficient documentation

## 2018-07-16 DIAGNOSIS — N838 Other noninflammatory disorders of ovary, fallopian tube and broad ligament: Secondary | ICD-10-CM

## 2018-07-16 DIAGNOSIS — D271 Benign neoplasm of left ovary: Secondary | ICD-10-CM

## 2018-07-16 DIAGNOSIS — Z6838 Body mass index (BMI) 38.0-38.9, adult: Secondary | ICD-10-CM | POA: Insufficient documentation

## 2018-07-16 DIAGNOSIS — D259 Leiomyoma of uterus, unspecified: Secondary | ICD-10-CM

## 2018-07-16 DIAGNOSIS — D251 Intramural leiomyoma of uterus: Secondary | ICD-10-CM | POA: Diagnosis not present

## 2018-07-16 DIAGNOSIS — E7800 Pure hypercholesterolemia, unspecified: Secondary | ICD-10-CM | POA: Diagnosis not present

## 2018-07-16 DIAGNOSIS — R1900 Intra-abdominal and pelvic swelling, mass and lump, unspecified site: Secondary | ICD-10-CM | POA: Diagnosis present

## 2018-07-16 DIAGNOSIS — I10 Essential (primary) hypertension: Secondary | ICD-10-CM | POA: Diagnosis not present

## 2018-07-16 DIAGNOSIS — E669 Obesity, unspecified: Secondary | ICD-10-CM | POA: Diagnosis present

## 2018-07-16 HISTORY — PX: ROBOTIC ASSISTED TOTAL HYSTERECTOMY WITH BILATERAL SALPINGO OOPHERECTOMY: SHX6086

## 2018-07-16 LAB — TYPE AND SCREEN
ABO/RH(D): O POS
Antibody Screen: NEGATIVE

## 2018-07-16 SURGERY — HYSTERECTOMY, TOTAL, ROBOT-ASSISTED, LAPAROSCOPIC, WITH BILATERAL SALPINGO-OOPHORECTOMY
Anesthesia: General

## 2018-07-16 MED ORDER — HYDROMORPHONE HCL 1 MG/ML IJ SOLN
INTRAMUSCULAR | Status: AC
  Filled 2018-07-16: qty 1

## 2018-07-16 MED ORDER — EPHEDRINE SULFATE 50 MG/ML IJ SOLN
INTRAMUSCULAR | Status: DC | PRN
  Administered 2018-07-16 (×2): 10 mg via INTRAVENOUS

## 2018-07-16 MED ORDER — KETAMINE HCL 10 MG/ML IJ SOLN
INTRAMUSCULAR | Status: DC | PRN
  Administered 2018-07-16: 30 mg via INTRAVENOUS

## 2018-07-16 MED ORDER — ALBUMIN HUMAN 5 % IV SOLN
INTRAVENOUS | Status: AC
  Filled 2018-07-16: qty 250

## 2018-07-16 MED ORDER — SODIUM CHLORIDE 0.9 % IV SOLN
250.0000 mL | INTRAVENOUS | Status: DC | PRN
  Filled 2018-07-16: qty 250

## 2018-07-16 MED ORDER — HYDROMORPHONE HCL 1 MG/ML IJ SOLN
0.2500 mg | INTRAMUSCULAR | Status: DC | PRN
  Administered 2018-07-16: 0.5 mg via INTRAVENOUS
  Filled 2018-07-16: qty 0.5

## 2018-07-16 MED ORDER — LIDOCAINE 2% (20 MG/ML) 5 ML SYRINGE
INTRAMUSCULAR | Status: DC | PRN
  Administered 2018-07-16: 40 mg via INTRAVENOUS

## 2018-07-16 MED ORDER — ACETAMINOPHEN 650 MG RE SUPP
650.0000 mg | RECTAL | Status: DC | PRN
  Filled 2018-07-16: qty 1

## 2018-07-16 MED ORDER — SODIUM CHLORIDE 0.9% FLUSH
3.0000 mL | INTRAVENOUS | Status: DC | PRN
  Filled 2018-07-16: qty 3

## 2018-07-16 MED ORDER — ENOXAPARIN SODIUM 40 MG/0.4ML ~~LOC~~ SOLN
40.0000 mg | SUBCUTANEOUS | Status: AC
  Administered 2018-07-16: 40 mg via SUBCUTANEOUS
  Filled 2018-07-16: qty 0.4

## 2018-07-16 MED ORDER — GABAPENTIN 300 MG PO CAPS
300.0000 mg | ORAL_CAPSULE | ORAL | Status: AC
  Administered 2018-07-16: 06:00:00 300 mg via ORAL
  Filled 2018-07-16: qty 1

## 2018-07-16 MED ORDER — OXYCODONE HCL 5 MG PO TABS
ORAL_TABLET | ORAL | Status: AC
  Filled 2018-07-16: qty 1

## 2018-07-16 MED ORDER — CEFAZOLIN SODIUM-DEXTROSE 2-4 GM/100ML-% IV SOLN
2.0000 g | INTRAVENOUS | Status: AC
  Administered 2018-07-16: 2 g via INTRAVENOUS
  Filled 2018-07-16: qty 100

## 2018-07-16 MED ORDER — DEXAMETHASONE SODIUM PHOSPHATE 10 MG/ML IJ SOLN
INTRAMUSCULAR | Status: AC
  Filled 2018-07-16: qty 1

## 2018-07-16 MED ORDER — SCOPOLAMINE 1 MG/3DAYS TD PT72
1.0000 | MEDICATED_PATCH | TRANSDERMAL | Status: DC
  Administered 2018-07-16: 1.5 mg via TRANSDERMAL
  Filled 2018-07-16: qty 1

## 2018-07-16 MED ORDER — SUGAMMADEX SODIUM 200 MG/2ML IV SOLN
INTRAVENOUS | Status: AC
  Filled 2018-07-16: qty 2

## 2018-07-16 MED ORDER — MEPERIDINE HCL 25 MG/ML IJ SOLN
6.2500 mg | INTRAMUSCULAR | Status: DC | PRN
  Filled 2018-07-16: qty 1

## 2018-07-16 MED ORDER — ROCURONIUM BROMIDE 10 MG/ML (PF) SYRINGE
PREFILLED_SYRINGE | INTRAVENOUS | Status: DC | PRN
  Administered 2018-07-16: 50 mg via INTRAVENOUS

## 2018-07-16 MED ORDER — PROMETHAZINE HCL 25 MG/ML IJ SOLN
6.2500 mg | INTRAMUSCULAR | Status: DC | PRN
  Filled 2018-07-16: qty 1

## 2018-07-16 MED ORDER — DEXAMETHASONE SODIUM PHOSPHATE 10 MG/ML IJ SOLN
INTRAMUSCULAR | Status: DC | PRN
  Administered 2018-07-16: 10 mg via INTRAVENOUS

## 2018-07-16 MED ORDER — FENTANYL CITRATE (PF) 250 MCG/5ML IJ SOLN
INTRAMUSCULAR | Status: AC
  Filled 2018-07-16: qty 5

## 2018-07-16 MED ORDER — ONDANSETRON HCL 4 MG/2ML IJ SOLN
INTRAMUSCULAR | Status: AC
  Filled 2018-07-16: qty 2

## 2018-07-16 MED ORDER — ONDANSETRON HCL 4 MG/2ML IJ SOLN
INTRAMUSCULAR | Status: DC | PRN
  Administered 2018-07-16: 4 mg via INTRAVENOUS

## 2018-07-16 MED ORDER — DEXAMETHASONE SODIUM PHOSPHATE 4 MG/ML IJ SOLN
4.0000 mg | INTRAMUSCULAR | Status: DC
  Filled 2018-07-16: qty 1

## 2018-07-16 MED ORDER — KETAMINE HCL 10 MG/ML IJ SOLN
INTRAMUSCULAR | Status: AC
  Filled 2018-07-16: qty 1

## 2018-07-16 MED ORDER — MORPHINE SULFATE (PF) 2 MG/ML IV SOLN
2.0000 mg | INTRAVENOUS | Status: DC | PRN
  Filled 2018-07-16: qty 1

## 2018-07-16 MED ORDER — ACETAMINOPHEN 10 MG/ML IV SOLN
1000.0000 mg | Freq: Once | INTRAVENOUS | Status: DC | PRN
  Filled 2018-07-16: qty 100

## 2018-07-16 MED ORDER — ACETAMINOPHEN 500 MG PO TABS
ORAL_TABLET | ORAL | Status: AC
  Filled 2018-07-16: qty 2

## 2018-07-16 MED ORDER — ACETAMINOPHEN 325 MG PO TABS
325.0000 mg | ORAL_TABLET | Freq: Once | ORAL | Status: DC
  Filled 2018-07-16: qty 2

## 2018-07-16 MED ORDER — MIDAZOLAM HCL 2 MG/2ML IJ SOLN
INTRAMUSCULAR | Status: DC | PRN
  Administered 2018-07-16: 1 mg via INTRAVENOUS

## 2018-07-16 MED ORDER — SUGAMMADEX SODIUM 200 MG/2ML IV SOLN
INTRAVENOUS | Status: DC | PRN
  Administered 2018-07-16: 200 mg via INTRAVENOUS

## 2018-07-16 MED ORDER — STERILE WATER FOR INJECTION IJ SOLN
INTRAMUSCULAR | Status: AC
  Filled 2018-07-16: qty 20

## 2018-07-16 MED ORDER — PROPOFOL 10 MG/ML IV BOLUS
INTRAVENOUS | Status: AC
  Filled 2018-07-16: qty 40

## 2018-07-16 MED ORDER — PROPOFOL 500 MG/50ML IV EMUL
INTRAVENOUS | Status: DC | PRN
  Administered 2018-07-16: 1.5 ug/kg/min via INTRAVENOUS

## 2018-07-16 MED ORDER — ALBUMIN HUMAN 5 % IV SOLN
INTRAVENOUS | Status: DC | PRN
  Administered 2018-07-16: 09:00:00 via INTRAVENOUS

## 2018-07-16 MED ORDER — SCOPOLAMINE 1 MG/3DAYS TD PT72
MEDICATED_PATCH | TRANSDERMAL | Status: AC
  Filled 2018-07-16: qty 1

## 2018-07-16 MED ORDER — FENTANYL CITRATE (PF) 100 MCG/2ML IJ SOLN
INTRAMUSCULAR | Status: DC | PRN
  Administered 2018-07-16: 75 ug via INTRAVENOUS
  Administered 2018-07-16: 50 ug via INTRAVENOUS
  Administered 2018-07-16: 75 ug via INTRAVENOUS

## 2018-07-16 MED ORDER — ACETAMINOPHEN 500 MG PO TABS
1000.0000 mg | ORAL_TABLET | ORAL | Status: AC
  Administered 2018-07-16: 1000 mg via ORAL
  Filled 2018-07-16: qty 2

## 2018-07-16 MED ORDER — OXYCODONE HCL 5 MG PO TABS
5.0000 mg | ORAL_TABLET | ORAL | Status: DC | PRN
  Administered 2018-07-16: 5 mg via ORAL
  Filled 2018-07-16: qty 2

## 2018-07-16 MED ORDER — ACETAMINOPHEN 160 MG/5ML PO SOLN
325.0000 mg | Freq: Once | ORAL | Status: DC
  Filled 2018-07-16: qty 20.3

## 2018-07-16 MED ORDER — ACETAMINOPHEN 325 MG PO TABS
650.0000 mg | ORAL_TABLET | ORAL | Status: DC | PRN
  Filled 2018-07-16: qty 2

## 2018-07-16 MED ORDER — SODIUM CHLORIDE 0.9% FLUSH
3.0000 mL | Freq: Two times a day (BID) | INTRAVENOUS | Status: DC
  Filled 2018-07-16: qty 3

## 2018-07-16 MED ORDER — PROPOFOL 10 MG/ML IV BOLUS
INTRAVENOUS | Status: DC | PRN
  Administered 2018-07-16: 110 mg via INTRAVENOUS
  Administered 2018-07-16: 50 mg via INTRAVENOUS

## 2018-07-16 MED ORDER — LIDOCAINE HCL 2 % IJ SOLN
INTRAMUSCULAR | Status: AC
  Filled 2018-07-16: qty 20

## 2018-07-16 MED ORDER — ARTIFICIAL TEARS OPHTHALMIC OINT
TOPICAL_OINTMENT | OPHTHALMIC | Status: AC
  Filled 2018-07-16: qty 3.5

## 2018-07-16 MED ORDER — LACTATED RINGERS IV SOLN
INTRAVENOUS | Status: DC
  Administered 2018-07-16: 11:00:00 via INTRAVENOUS
  Filled 2018-07-16: qty 1000

## 2018-07-16 MED ORDER — BUPIVACAINE HCL 0.25 % IJ SOLN
INTRAMUSCULAR | Status: DC | PRN
  Administered 2018-07-16: 15 mL

## 2018-07-16 MED ORDER — LIDOCAINE 2% (20 MG/ML) 5 ML SYRINGE
INTRAMUSCULAR | Status: DC | PRN
  Administered 2018-07-16: 1.5 mg/kg/h via INTRAVENOUS

## 2018-07-16 MED ORDER — LIDOCAINE 2% (20 MG/ML) 5 ML SYRINGE
INTRAMUSCULAR | Status: AC
  Filled 2018-07-16: qty 5

## 2018-07-16 MED ORDER — LACTATED RINGERS IV SOLN
INTRAVENOUS | Status: DC
  Administered 2018-07-16 (×2): via INTRAVENOUS
  Filled 2018-07-16: qty 1000

## 2018-07-16 MED ORDER — GABAPENTIN 300 MG PO CAPS
ORAL_CAPSULE | ORAL | Status: AC
  Filled 2018-07-16: qty 1

## 2018-07-16 MED ORDER — ENOXAPARIN SODIUM 40 MG/0.4ML ~~LOC~~ SOLN
SUBCUTANEOUS | Status: AC
  Filled 2018-07-16: qty 0.4

## 2018-07-16 MED ORDER — CEFAZOLIN SODIUM-DEXTROSE 2-4 GM/100ML-% IV SOLN
INTRAVENOUS | Status: AC
  Filled 2018-07-16: qty 100

## 2018-07-16 MED ORDER — MIDAZOLAM HCL 2 MG/2ML IJ SOLN
INTRAMUSCULAR | Status: AC
  Filled 2018-07-16: qty 2

## 2018-07-16 SURGICAL SUPPLY — 58 items
APPLICATOR SURGIFLO ENDO (HEMOSTASIS) IMPLANT
BAG LAPAROSCOPIC 12 15 PORT 16 (BASKET) IMPLANT
BAG RETRIEVAL 12/15 (BASKET)
COVER BACK TABLE 60X90IN (DRAPES) ×2 IMPLANT
COVER TIP SHEARS 8 DVNC (MISCELLANEOUS) ×1 IMPLANT
COVER TIP SHEARS 8MM DA VINCI (MISCELLANEOUS) ×1
COVER WAND RF STERILE (DRAPES) IMPLANT
DECANTER SPIKE VIAL GLASS SM (MISCELLANEOUS) ×2 IMPLANT
DERMABOND ADVANCED (GAUZE/BANDAGES/DRESSINGS) ×1
DERMABOND ADVANCED .7 DNX12 (GAUZE/BANDAGES/DRESSINGS) ×1 IMPLANT
DRAPE ARM DVNC X/XI (DISPOSABLE) ×4 IMPLANT
DRAPE COLUMN DVNC XI (DISPOSABLE) ×1 IMPLANT
DRAPE DA VINCI XI ARM (DISPOSABLE) ×4
DRAPE DA VINCI XI COLUMN (DISPOSABLE) ×1
DRAPE SHEET LG 3/4 BI-LAMINATE (DRAPES) ×2 IMPLANT
DRAPE SURG IRRIG POUCH 19X23 (DRAPES) ×2 IMPLANT
ELECT REM PT RETURN 15FT ADLT (MISCELLANEOUS) ×2 IMPLANT
GAUZE SPONGE 4X4 16PLY XRAY LF (GAUZE/BANDAGES/DRESSINGS) ×2 IMPLANT
GLOVE BIO SURGEON STRL SZ 6 (GLOVE) ×8 IMPLANT
GLOVE BIO SURGEON STRL SZ 6.5 (GLOVE) IMPLANT
GOWN STRL REUS W/ TWL LRG LVL3 (GOWN DISPOSABLE) ×2 IMPLANT
GOWN STRL REUS W/TWL LRG LVL3 (GOWN DISPOSABLE) ×2
HOLDER FOLEY CATH W/STRAP (MISCELLANEOUS) IMPLANT
IRRIG SUCT STRYKERFLOW 2 WTIP (MISCELLANEOUS) ×2
IRRIGATION SUCT STRKRFLW 2 WTP (MISCELLANEOUS) ×1 IMPLANT
KIT PROCEDURE DA VINCI SI (MISCELLANEOUS)
KIT PROCEDURE DVNC SI (MISCELLANEOUS) IMPLANT
KIT TURNOVER KIT A (KITS) IMPLANT
MANIPULATOR UTERINE 4.5 ZUMI (MISCELLANEOUS) ×2 IMPLANT
NEEDLE HYPO 22GX1.5 SAFETY (NEEDLE) ×2 IMPLANT
NEEDLE SPNL 18GX3.5 QUINCKE PK (NEEDLE) IMPLANT
OBTURATOR OPTICAL STANDARD 8MM (TROCAR) ×1
OBTURATOR OPTICAL STND 8 DVNC (TROCAR) ×1
OBTURATOR OPTICALSTD 8 DVNC (TROCAR) ×1 IMPLANT
PACK ROBOT GYN CUSTOM WL (TRAY / TRAY PROCEDURE) ×2 IMPLANT
PAD POSITIONING PINK XL (MISCELLANEOUS) ×2 IMPLANT
PORT ACCESS TROCAR AIRSEAL 12 (TROCAR) ×1 IMPLANT
PORT ACCESS TROCAR AIRSEAL 5M (TROCAR) ×1
POUCH SPECIMEN RETRIEVAL 10MM (ENDOMECHANICALS) IMPLANT
SEAL CANN UNIV 5-8 DVNC XI (MISCELLANEOUS) ×3 IMPLANT
SEAL XI 5MM-8MM UNIVERSAL (MISCELLANEOUS) ×3
SET TRI-LUMEN FLTR TB AIRSEAL (TUBING) ×2 IMPLANT
SURGIFLO W/THROMBIN 8M KIT (HEMOSTASIS) IMPLANT
SUT VIC AB 0 CT1 27 (SUTURE)
SUT VIC AB 0 CT1 27XBRD ANTBC (SUTURE) IMPLANT
SUT VIC AB 3-0 SH 27 (SUTURE) ×5
SUT VIC AB 3-0 SH 27XBRD (SUTURE) ×5 IMPLANT
SUT VIC AB 4-0 PS2 18 (SUTURE) ×4 IMPLANT
SUT VIC AB 4-0 PS2 27 (SUTURE) ×4 IMPLANT
SYR 10ML LL (SYRINGE) ×2 IMPLANT
SYR BULB IRRIGATION 50ML (SYRINGE) ×2 IMPLANT
TOWEL OR NON WOVEN STRL DISP B (DISPOSABLE) ×2 IMPLANT
TRAP SPECIMEN MUCOUS 40CC (MISCELLANEOUS) ×2 IMPLANT
TRAY FOLEY MTR SLVR 16FR STAT (SET/KITS/TRAYS/PACK) ×2 IMPLANT
TUBE CONNECTING 12X1/4 (SUCTIONS) ×2 IMPLANT
UNDERPAD 30X30 (UNDERPADS AND DIAPERS) ×2 IMPLANT
WATER STERILE IRR 1000ML POUR (IV SOLUTION) ×2 IMPLANT
YANKAUER SUCT BULB TIP NO VENT (SUCTIONS) ×2 IMPLANT

## 2018-07-16 NOTE — Transfer of Care (Signed)
Immediate Anesthesia Transfer of Care Note  Patient: Dana Gregory  Procedure(s) Performed: XI ROBOTIC ASSISTED TOTAL HYSTERECTOMY WITH BILATERAL SALPINGO OOPHORECTOMY (N/A )  Patient Location: PACU  Anesthesia Type:General  Level of Consciousness: awake, alert , oriented and patient cooperative  Airway & Oxygen Therapy: Patient Spontanous Breathing and Patient connected to nasal cannula oxygen  Post-op Assessment: Report given to RN and Post -op Vital signs reviewed and stable  Post vital signs: Reviewed and stable  Last Vitals:  Vitals Value Taken Time  BP 133/97 07/16/2018 10:02 AM  Temp    Pulse 78 07/16/2018 10:03 AM  Resp 16 07/16/2018 10:03 AM  SpO2 100 % 07/16/2018 10:03 AM  Vitals shown include unvalidated device data.  Last Pain:  Vitals:   07/16/18 0540  TempSrc: Oral  PainSc: 0-No pain      Patients Stated Pain Goal: 5 (50/01/64 2903)  Complications: No apparent anesthesia complications

## 2018-07-16 NOTE — Anesthesia Postprocedure Evaluation (Signed)
Anesthesia Post Note  Patient: Dana Gregory  Procedure(s) Performed: XI ROBOTIC ASSISTED TOTAL HYSTERECTOMY WITH BILATERAL SALPINGO OOPHORECTOMY (N/A )     Patient location during evaluation: PACU Anesthesia Type: General Level of consciousness: awake and alert Pain management: pain level controlled Vital Signs Assessment: post-procedure vital signs reviewed and stable Respiratory status: spontaneous breathing, nonlabored ventilation, respiratory function stable and patient connected to nasal cannula oxygen Cardiovascular status: blood pressure returned to baseline and stable Postop Assessment: no apparent nausea or vomiting Anesthetic complications: no    Last Vitals:  Vitals:   07/16/18 1155 07/16/18 1200  BP:  121/62  Pulse:  77  Resp:  18  Temp:    SpO2: 92% 94%    Last Pain:  Vitals:   07/16/18 1200  TempSrc:   PainSc: 2                  Effie Berkshire

## 2018-07-16 NOTE — Anesthesia Preprocedure Evaluation (Addendum)
Anesthesia Evaluation  Patient identified by MRN, date of birth, ID band Patient awake    Reviewed: Allergy & Precautions, NPO status , Patient's Chart, lab work & pertinent test results  Airway Mallampati: II  TM Distance: >3 FB Neck ROM: Full    Dental  (+) Teeth Intact, Caps, Dental Advisory Given,    Pulmonary neg pulmonary ROS,    breath sounds clear to auscultation       Cardiovascular hypertension, Pt. on medications + dysrhythmias  Rhythm:Regular Rate:Normal     Neuro/Psych negative neurological ROS     GI/Hepatic negative GI ROS, Neg liver ROS,   Endo/Other  negative endocrine ROS  Renal/GU negative Renal ROS     Musculoskeletal negative musculoskeletal ROS (+)   Abdominal (+) + obese,   Peds  Hematology negative hematology ROS (+)   Anesthesia Other Findings   Reproductive/Obstetrics                          Lab Results  Component Value Date   WBC 7.1 07/10/2018   HGB 15.1 (H) 07/10/2018   HCT 46.4 (H) 07/10/2018   MCV 88.7 07/10/2018   PLT 346 07/10/2018   Lab Results  Component Value Date   CREATININE 0.81 07/10/2018   BUN 12 07/10/2018   NA 139 07/10/2018   K 4.2 07/10/2018   CL 105 07/10/2018   CO2 25 07/10/2018   No results found for: INR, PROTIME   Anesthesia Physical Anesthesia Plan  ASA: II  Anesthesia Plan: General   Post-op Pain Management:    Induction: Intravenous  PONV Risk Score and Plan: 4 or greater and Ondansetron, Dexamethasone and Treatment may vary due to age or medical condition  Airway Management Planned: Oral ETT  Additional Equipment: None  Intra-op Plan:   Post-operative Plan: Extubation in OR  Informed Consent: I have reviewed the patients History and Physical, chart, labs and discussed the procedure including the risks, benefits and alternatives for the proposed anesthesia with the patient or authorized representative who has  indicated his/her understanding and acceptance.     Dental advisory given  Plan Discussed with: CRNA  Anesthesia Plan Comments:        Anesthesia Quick Evaluation

## 2018-07-16 NOTE — H&P (Signed)
H&P Note: Gyn-Onc  Consult was requested by Dr. Ihor Dow for the evaluation of Dana Gregory 73 y.o. female  CC:  Ovarian cyst, obesity.   Assessment/Plan:  Dana Gregory  is a 73 y.o.  year old with a 7cm complex left ovarian cyst.  The tumor markers are normal and there are no signs of metastatic disease on imaging, therefore the probability is highest that this is benign. I offered the patient the option of close surveillance, however, she is interested in definitive management and diagnosis with surgery, which I believe is appropriate as she is of average risk for complications.   I discussed surgical options including BSO versus hysterectomy with BSO.  The patient is electing for hysterectomy with BSO.  I discussed that this can be achieved through a minimally invasive route with robotic assistance.  I discussed that this is an outpatient procedure.  We discussed surgical risks including  bleeding, infection, damage to internal organs (such as bladder,ureters, bowels), blood clot, reoperation and rehospitalization.  I discussed that if malignancy is identified on frozen section we will perform staging including lymph node dissection and peritoneal biopsies with omentectomy.  I discussed anticipated recovery postoperative symptoms.  Surgery will be scheduled for later this month after she is undergone her colonoscopy on May 22, 2018.  HPI: Ms Dana Gregory is a 73 year old P1 who is seen in consultation at the request of Dr Ihor Dow for a 7cm left ovarian cystic mass.   The patient was undergoing a CT scan of the abdomen and pelvis ordered by her primary care doctor due to her sister's recent death from pancreatic cancer.  The CT scan was performed on April 03, 2018.  It revealed a normal pancreas and upper abdomen, left colonic diverticulosis, normal appendix, but a large midline pelvic mass was seen along the anterior superior aspect of the uterus measuring 7.1 x  6 cm.  It was new since 2010.  She then subsequently underwent MRI on April 20, 2018 and this revealed a uterus measuring 5.9 x 3.7 x 4.5 cm with multiple small intramural fibroids.  Was pressing on the uterine fundus but did not appear to arise from the uterus itself.  The previous CT scan and the MRI both identified left gonadal vessels entering the cystic mass.  There is relatively diffuse enhancement after IV administration suggesting a predominantly solid mass.  Tumor markers were drawn including a Ca1 25 which was normal at 7.4, a T4 which was normal at 67.9 and a ROMA goal which was normal and postmenopausal range of 0.96.  The patient is asymptomatic.  She has a history of one prior vaginal delivery no prior abdominal surgeries.  She is treated for hypercholesterolemia and hypertension.  Her family history is significant for a sister who had a diagnosis of pancreatic cancer for which she died.  The patient was recently tested for fecal occult blood testing which was positive and she has a colonoscopy scheduled for May 22, 2018.  He is retired. Her BMI is 38kg/m2.  Current Meds:  Outpatient Encounter Medications as of 05/13/2018  Medication Sig  . AMLODIPINE BESYLATE PO Take 5 mg by mouth.  . pravastatin (PRAVACHOL) 40 MG tablet Take 40 mg by mouth daily.   No facility-administered encounter medications on file as of 05/13/2018.     Allergy: No Known Allergies  Social Hx:   Social History   Socioeconomic History  . Marital status: Single    Spouse name: Not on  file  . Number of children: Not on file  . Years of education: Not on file  . Highest education level: Not on file  Occupational History  . Not on file  Social Needs  . Financial resource strain: Not on file  . Food insecurity:    Worry: Not on file    Inability: Not on file  . Transportation needs:    Medical: Not on file    Non-medical: Not on file  Tobacco Use  . Smoking status: Never Smoker  . Smokeless  tobacco: Never Used  Substance and Sexual Activity  . Alcohol use: No  . Drug use: No  . Sexual activity: Never  Lifestyle  . Physical activity:    Days per week: Not on file    Minutes per session: Not on file  . Stress: Not on file  Relationships  . Social connections:    Talks on phone: Not on file    Gets together: Not on file    Attends religious service: Not on file    Active member of club or organization: Not on file    Attends meetings of clubs or organizations: Not on file    Relationship status: Not on file  . Intimate partner violence:    Fear of current or ex partner: Not on file    Emotionally abused: Not on file    Physically abused: Not on file    Forced sexual activity: Not on file  Other Topics Concern  . Not on file  Social History Narrative  . Not on file    Past Surgical Hx:  Past Surgical History:  Procedure Laterality Date  . FINGER SURGERY    . FOOT SURGERY    . REDUCTION MAMMAPLASTY Bilateral     Past Medical Hx:  Past Medical History:  Diagnosis Date  . Diverticulitis   . Fibroid   . Hyperlipidemia   . Hypertension     Past Gynecological History:  SVD x 1 No LMP recorded. Patient is postmenopausal.  Family Hx:  Family History  Problem Relation Age of Onset  . Heart disease Mother   . Hypertension Mother   . Breast cancer Neg Hx     Review of Systems:  Constitutional  Feels well,    ENT Normal appearing ears and nares bilaterally Skin/Breast  No rash, sores, jaundice, itching, dryness Cardiovascular  No chest pain, shortness of breath, or edema  Pulmonary  No cough or wheeze.  Gastro Intestinal  No nausea, vomitting, or diarrhoea. No bright red blood per rectum, no abdominal pain, change in bowel movement, or constipation.  Genito Urinary  No frequency, urgency, dysuria, no bleeding Musculo Skeletal  No myalgia, arthralgia, joint swelling or pain  Neurologic  No weakness, numbness, change in gait,  Psychology  No  depression, anxiety, insomnia.   Vitals:  Blood pressure (!) 169/85, pulse 86, temperature 97.9 F (36.6 C), temperature source Oral, resp. rate 18, height 5\' 3"  (1.6 m), weight 215 lb 3.2 oz (97.6 kg), SpO2 99 %.  Physical Exam: WD in NAD Neck  Supple NROM, without any enlargements.  Lymph Node Survey No cervical supraclavicular or inguinal adenopathy Cardiovascular  Pulse normal rate, regularity and rhythm. S1 and S2 normal.  Lungs  Clear to auscultation bilateraly, without wheezes/crackles/rhonchi. Good air movement.  Skin  No rash/lesions/breakdown  Psychiatry  Alert and oriented to person, place, and time  Abdomen  Normoactive bowel sounds, abdomen soft, non-tender and slightly obese without evidence of hernia.  Back No CVA tenderness Genito Urinary  Vulva/vagina: Normal external female genitalia.   No lesions. No discharge or bleeding.  Bladder/urethra:  No lesions or masses, well supported bladder  Vagina: normal  Cervix: Normal appearing, no lesions.  Uterus:  Small, mobile, no parametrial involvement or nodularity.  Adnexa: no discretely palpable masses though exam is limited by body habitus Rectal  deferred Extremities  No bilateral cyanosis, clubbing or edema.   Thereasa Solo, MD  07/16/2018, 7:18 AM

## 2018-07-16 NOTE — Anesthesia Procedure Notes (Signed)
Procedure Name: Intubation Date/Time: 07/16/2018 7:37 AM Performed by: Wanita Chamberlain, CRNA Pre-anesthesia Checklist: Patient identified, Emergency Drugs available, Suction available, Patient being monitored and Timeout performed Patient Re-evaluated:Patient Re-evaluated prior to induction Oxygen Delivery Method: Circle system utilized Preoxygenation: Pre-oxygenation with 100% oxygen Induction Type: IV induction Ventilation: Mask ventilation without difficulty Grade View: Grade II Tube type: Oral Number of attempts: 1 Airway Equipment and Method: Stylet Placement Confirmation: ETT inserted through vocal cords under direct vision,  positive ETCO2,  CO2 detector and breath sounds checked- equal and bilateral Secured at: 22 cm Tube secured with: Tape Dental Injury: Teeth and Oropharynx as per pre-operative assessment

## 2018-07-16 NOTE — Op Note (Signed)
OPERATIVE NOTE 06/16/18  Surgeon: Donaciano Eva   Assistants: Dr Lahoma Crocker (an MD assistant was necessary for tissue manipulation, management of robotic instrumentation, retraction and positioning due to the complexity of the case and hospital policies).   Anesthesia: General endotracheal anesthesia  ASA Class: 3   Pre-operative Diagnosis: left ovarian solid mass, fibroid uterus, obesity (BMI 38kg/m2)  Post-operative Diagnosis: same and left ovarian fibroma  Operation: Robotic-assisted laparoscopic total hysterectomy with bilateral salpingoophorectomy   Surgeon: Donaciano Eva  Assistant Surgeon: Lahoma Crocker MD  Anesthesia: GET  Urine Output: 50cc  Operative Findings:  : 8cm fibroid uterus, 8cm solid left ovarian mass (fibroma on frozen section), normal right ovary, no intraperitoneal disease, morbid obesity with BMI 38kg/m2 which increased time of positioning by 30 minutes and increased complexity of the procedure and required an additional 30 minutes of operating time for exposure in the retroperitoneum   Estimated Blood Loss:  20cc      Total IV Fluids: 700 ml         Specimens: uterus, cervix, bilateral tubes and ovaries. Frozen section of left ovary (fibroma).         Complications:  None; patient tolerated the procedure well.         Disposition: PACU - hemodynamically stable.  Procedure Details  The patient was seen in the Holding Room. The risks, benefits, complications, treatment options, and expected outcomes were discussed with the patient.  The patient concurred with the proposed plan, giving informed consent.  The site of surgery properly noted/marked. The patient was identified as Dana Gregory and the procedure verified as a Robotic-assisted hysterectomy with bilateral salpingo oophorectomy. A Time Out was held and the above information confirmed.  After induction of anesthesia, the patient was draped and prepped in the usual sterile  manner. Pt was placed in supine position after anesthesia and draped and prepped in the usual sterile manner. The abdominal drape was placed after the CholoraPrep had been allowed to dry for 3 minutes.  Her arms were tucked to her side with all appropriate precautions.  The shoulders were stabilized with padded shoulder blocks applied to the acromium processes.  The patient was placed in the semi-lithotomy position in [place].  The perineum was prepped with Betadine. The patient was then prepped. Foley catheter was placed.  A sterile speculum was placed in the vagina.  The cervix was grasped with a single-tooth tenaculum and dilated with Kennon Rounds dilators.  The ZUMI uterine manipulator with a medium colpotomizer ring was placed without difficulty.  A pneum occluder balloon was placed over the manipulator.  OG tube placement was confirmed and to suction.   Next, a 5 mm skin incision was made 1 cm below the subcostal margin in the midclavicular line.  The 5 mm Optiview port and scope was used for direct entry.  Opening pressure was under 10 mm CO2.  The abdomen was insufflated and the findings were noted as above.   At this point and all points during the procedure, the patient's intra-abdominal pressure did not exceed 15 mmHg. Next, a 10 mm skin incision was made in the umbilicus and a right and left port was placed about 10 cm lateral to the robot port on the right and left side.  A fourth arm was placed in the left lower quadrant 2 cm above and superior and medial to the anterior superior iliac spine.  All ports were placed under direct visualization.  The patient was placed in steep  Trendelenburg.  Bowel was folded away into the upper abdomen.  The robot was docked in the normal manner.  The hysterectomy was started after the round ligament on the right side was incised and the retroperitoneum was entered and the pararectal space was developed.  The ureter was noted to be on the medial leaf of the broad  ligament.  The peritoneum above the ureter was incised and stretched and the infundibulopelvic ligament was skeletonized, cauterized and cut.  The posterior peritoneum was taken down to the level of the KOH ring.  The anterior peritoneum was also taken down.  The bladder flap was created to the level of the KOH ring.  The uterine artery on the right side was skeletonized, cauterized and cut in the normal manner.  A similar procedure was performed on the left.  The colpotomy was made and the uterus, cervix, bilateral ovaries and tubes were amputated and delivered through the vagina.  There was Pedicles were inspected and excellent hemostasis was achieved.    The colpotomy at the vaginal cuff was closed with Vicryl on a CT1 needle in a running manner.  Irrigation was used and excellent hemostasis was achieved.  At this point in the procedure was completed.  Robotic instruments were removed under direct visulaization.  The robot was undocked. The 10 mm ports were closed with Vicryl on a UR-5 needle and the fascia was closed with 0 Vicryl on a UR-5 needle.  The skin was closed with 4-0 Vicryl in a subcuticular manner.  Dermabond was applied.  Sponge, lap and needle counts correct x 2.  The patient was taken to the recovery room in stable condition.  The vagina was swabbed with bleeding noted from the left sidewall and right peri-urethral area (this was made hemostatic with 3-0 vicryl).   All instrument and needle counts were correct x  3.   The patient was transferred to the recovery room in a stable condition.  Donaciano Eva, MD

## 2018-07-16 NOTE — Discharge Instructions (Signed)
07/16/2018  Return to work: 4 weeks  Activity: 1. Be up and out of the bed during the day.  Take a nap if needed.  You may walk up steps but be careful and use the hand rail.  Stair climbing will tire you more than you think, you may need to stop part way and rest.   2. No lifting or straining for 4 weeks.  3. No driving for 10 days.  Do Not drive if you are taking narcotic pain medicine.  4. Shower daily.  Use soap and water on your incision and pat dry; don't rub.   5. No sexual activity and nothing in the vagina for 8 weeks.  Medications:  - Take ibuprofen and tylenol first line for pain control. Take these regularly (every 6 hours) to decrease the build up of pain.  - If necessary, for severe pain not relieved by ibuprofen, take percocet.  - While taking percocet you should take sennakot every night to reduce the likelihood of constipation. If this causes diarrhea, stop its use.  Diet: 1. Low sodium Heart Healthy Diet is recommended.  2. It is safe to use a laxative if you have difficulty moving your bowels.   Wound Care: 1. Keep clean and dry.  Shower daily.  Reasons to call the Doctor:   Fever - Oral temperature greater than 100.4 degrees Fahrenheit  Foul-smelling vaginal discharge  Difficulty urinating  Nausea and vomiting  Increased pain at the site of the incision that is unrelieved with pain medicine.  Difficulty breathing with or without chest pain  New calf pain especially if only on one side  Sudden, continuing increased vaginal bleeding with or without clots.   Follow-up: 1. See Everitt Amber in 3-4 weeks. Her office will make this procedure.  Contacts: For questions or concerns you should contact:  Dr. Everitt Amber at [phone] After hours and on week-ends call [phone] and ask to speak to the physician on call for Gynecologic Oncology   Total Laparoscopic Hysterectomy, Care After This sheet gives you information about how to care for  yourself after your procedure. Your health care provider may also give you more specific instructions. If you have problems or questions, contact your health care provider. What can I expect after the procedure? After the procedure, it is common to have:  Pain and bruising around your incisions.  A sore throat, if a breathing tube was used during surgery.  Fatigue.  Poor appetite.  Less interest in sex. If your ovaries were also removed, it is also common to have symptoms of menopause such as hot flashes, night sweats, and lack of sleep (insomnia). Follow these instructions at home: Bathing  Do not take baths, swim, or use a hot tub until your health care provider approves. You may need to only take showers for 2-3 weeks.  Keep your bandage (dressing) dry until your health care provider says it can be removed. Incision care   Follow instructions from your health care provider about how to take care of your incisions. Make sure you: ? Wash your hands with soap and water before you change your dressing. If soap and water are not available, use hand sanitizer. ? Change your dressing as told by your health care provider. ? Leave stitches (sutures), skin glue, or adhesive strips in place. These skin closures may need to stay in place for 2 weeks or longer. If adhesive strip edges start to loosen and curl up, you may trim the  loose edges. Do not remove adhesive strips completely unless your health care provider tells you to do that.  Check your incision area every day for signs of infection. Check for: ? Redness, swelling, or pain. ? Fluid or blood. ? Warmth. ? Pus or a bad smell. Activity  Get plenty of rest and sleep.  Do not lift anything that is heavier than 10 lbs (4.5 kg) for one month after surgery, or as long as told by your health care provider.  Do not drive or use heavy machinery while taking prescription pain medicine.  Do not drive for 24 hours if you were given a  medicine to help you relax (sedative).  Return to your normal activities as told by your health care provider. Ask your health care provider what activities are safe for you. Lifestyle   Do not use any products that contain nicotine or tobacco, such as cigarettes and e-cigarettes. These can delay healing. If you need help quitting, ask your health care provider.  Do not drink alcohol until your health care provider approves. General instructions  Do not douche, use tampons, or have sex for at least 6 weeks, or as told by your health care provider.  Take over-the-counter and prescription medicines only as told by your health care provider.  To monitor yourself for a fever, take your temperature at least once a day during recovery.  If you struggle with physical or emotional changes after your procedure, speak with your health care provider or a therapist.  To prevent or treat constipation while you are taking prescription pain medicine, your health care provider may recommend that you: ? Drink enough fluid to keep your urine clear or pale yellow. ? Take over-the-counter or prescription medicines. ? Eat foods that are high in fiber, such as fresh fruits and vegetables, whole grains, and beans. ? Limit foods that are high in fat and processed sugars, such as fried and sweet foods.  Keep all follow-up visits as told by your health care provider. This is important. Contact a health care provider if:  You have chills or a fever.  You have redness, swelling, or pain around an incision.  You have fluid or blood coming from an incision.  Your incision feels warm to the touch.  You have pus or a bad smell coming from an incision.  An incision breaks open.  You feel dizzy or light-headed.  You have pain or bleeding when you urinate.  You have diarrhea, nausea, or vomiting that does not go away.  You have abnormal vaginal discharge.  You have a rash.  You have pain that does not  get better with medicine. Get help right away if:  You have a fever and your symptoms suddenly get worse.  You have severe abdominal pain.  You have chest pain.  You have shortness of breath.  You faint.  You have pain, swelling, or redness on your leg.  You have heavy vaginal bleeding with blood clots. Summary  After the procedure it is common to have abdominal pain. Your provider will give you medication for this.  Do not take baths, swim, or use a hot tub until your health care provider approves.  Do not lift anything that is heavier than 10 lbs (4.5 kg) for one month after surgery, or as long as told by your health care provider.  Notify your provider if you have any signs or symptoms of infection after the procedure. This information is not intended to replace advice  given to you by your health care provider. Make sure you discuss any questions you have with your health care provider. Document Released: 12/09/2012 Document Revised: 05/01/2016 Document Reviewed: 05/01/2016 Elsevier Interactive Patient Education  2019 [place] [facility] Care Instructions  Activity: Get plenty of rest for the remainder of the day. A responsible individual must stay with you for 24 hours following the procedure.  For the next 24 hours, DO NOT: -Drive a car -Paediatric nurse -Drink alcoholic beverages -Take any medication unless instructed by your physician -Make any legal decisions or sign important papers.  Meals: Start with liquid foods such as gelatin or soup. Progress to regular foods as tolerated. Avoid greasy, spicy, heavy foods. If nausea and/or vomiting occur, drink only clear liquids until the nausea and/or vomiting subsides. Call your physician if vomiting continues.  Special Instructions/Symptoms: Your throat may feel dry or sore from the anesthesia or the breathing tube placed in your throat during surgery. If this causes discomfort, gargle with warm  salt water. The discomfort should disappear within 24 hours.  If you had a scopolamine patch placed behind your ear for the management of post- operative nausea and/or vomiting:  1. The medication in the patch is effective for 72 hours, after which it should be removed.  Wrap patch in a tissue and discard in the trash. Wash hands thoroughly with soap and water. 2. You may remove the patch earlier than 72 hours if you experience unpleasant side effects which may include dry mouth, dizziness or visual disturbances. 3. Avoid touching the patch. Wash your hands with soap and water after contact with the patch.

## 2018-07-17 ENCOUNTER — Telehealth: Payer: Self-pay

## 2018-07-17 NOTE — Telephone Encounter (Signed)
Told Dana Gregory that the final surgical pathology showed no cancer per Dana John, NP.  Dana Gregory states she is doing well from her surgery. She is afebrile. Incisions look good. She is drinking well. Ate some soup today. She is getting up and moving around. She is passing gas. No bowel movement yet.   She will begin the senokot 2 tabs this evening.   She knows to call the office if she has any questions or concerns.

## 2018-07-29 ENCOUNTER — Ambulatory Visit: Payer: Medicare Other | Admitting: Gynecologic Oncology

## 2018-08-10 ENCOUNTER — Inpatient Hospital Stay: Payer: Medicare Other | Attending: Gynecologic Oncology | Admitting: Gynecologic Oncology

## 2018-08-10 ENCOUNTER — Other Ambulatory Visit: Payer: Self-pay

## 2018-08-10 ENCOUNTER — Encounter: Payer: Self-pay | Admitting: Gynecologic Oncology

## 2018-08-10 VITALS — BP 148/88 | HR 85 | Temp 98.9°F | Resp 18 | Ht 63.0 in | Wt 213.5 lb

## 2018-08-10 DIAGNOSIS — Z90710 Acquired absence of both cervix and uterus: Secondary | ICD-10-CM | POA: Diagnosis not present

## 2018-08-10 DIAGNOSIS — D271 Benign neoplasm of left ovary: Secondary | ICD-10-CM | POA: Diagnosis present

## 2018-08-10 DIAGNOSIS — R1900 Intra-abdominal and pelvic swelling, mass and lump, unspecified site: Secondary | ICD-10-CM

## 2018-08-10 DIAGNOSIS — Z90722 Acquired absence of ovaries, bilateral: Secondary | ICD-10-CM | POA: Diagnosis not present

## 2018-08-10 NOTE — Patient Instructions (Signed)
Dr Denman George removed your uterus, cervix, both tubes and ovaries. No cancer was found. There was a benign mass on the left ovary called fibroma.  No follow-up is needed for this.  You should continue to follow-up annually for a wellness visit with your primary care provider and Dr Ihor Dow.

## 2018-08-10 NOTE — Progress Notes (Signed)
Post-op follow-up Note: Gyn-Onc  Consult was requested by Dr. Ihor Dow for the evaluation of Dana Gregory 73 y.o. female  CC:  Chief Complaint  Patient presents with  . left ovarian fibroma    post-op follow-up    Assessment/Plan:  Ms. Dana Gregory  is a 73 y.o.  year old with a history of a hysterectomy, BSO on 07/16/18 for a left ovarian fibroma (benign).  I discussed her pathology with her and recommendation is for no specific follow-up for this.  HPI: Ms Dana Gregory is a 73 year old P1 who is seen in consultation at the request of Dr Ihor Dow for a 7cm left ovarian cystic mass.   The patient was undergoing a CT scan of the abdomen and pelvis ordered by her primary care doctor due to her sister's recent death from pancreatic cancer.  The CT scan was performed on April 03, 2018.  It revealed a normal pancreas and upper abdomen, left colonic diverticulosis, normal appendix, but a large midline pelvic mass was seen along the anterior superior aspect of the uterus measuring 7.1 x 6 cm.  It was new since 2010.  She then subsequently underwent MRI on April 20, 2018 and this revealed a uterus measuring 5.9 x 3.7 x 4.5 cm with multiple small intramural fibroids.  Was pressing on the uterine fundus but did not appear to arise from the uterus itself.  The previous CT scan and the MRI both identified left gonadal vessels entering the cystic mass.  There is relatively diffuse enhancement after IV administration suggesting a predominantly solid mass.  Tumor markers were drawn including a Ca1 25 which was normal at 7.4, a T4 which was normal at 67.9 and a ROMA goal which was normal and postmenopausal range of 0.96.  The patient is asymptomatic.  She has a history of one prior vaginal delivery no prior abdominal surgeries.  She is treated for hypercholesterolemia and hypertension.  Her family history is significant for a sister who had a diagnosis of pancreatic cancer for which  she died.  The patient was recently tested for fecal occult blood testing which was positive and she has a colonoscopy scheduled for May 22, 2018.  He is retired. Her BMI is 38kg/m2.  Interval Hx:  On 07/16/18 she underwent robotic assisted total hysterectomy, BSO. Frozen section and final pathology revealed a benign left ovarian fibroma.  Postoperatively she did well with no complaints.   Current Meds:  Outpatient Encounter Medications as of 08/10/2018  Medication Sig  . amLODipine (NORVASC) 5 MG tablet Take 5 mg by mouth daily.  . Carboxymethylcellulose Sodium (ARTIFICIAL TEARS OP) Place 1 drop into both eyes daily as needed (dry eyes).  Marland Kitchen ibuprofen (ADVIL,MOTRIN) 600 MG tablet Take 1 tablet (600 mg total) by mouth every 6 (six) hours as needed for moderate pain. For AFTER surgery  . latanoprost (XALATAN) 0.005 % ophthalmic solution Place 1 drop into the right eye at bedtime.  . pravastatin (PRAVACHOL) 40 MG tablet Take 40 mg by mouth every evening.   . senna-docusate (SENOKOT-S) 8.6-50 MG tablet Take 2 tablets by mouth at bedtime. For AFTER surgery, do not take if having loose stools  . traMADol (ULTRAM) 50 MG tablet Take 1 tablet (50 mg total) by mouth every 6 (six) hours as needed for moderate pain. For AFTER surgery, do not take and drive   No facility-administered encounter medications on file as of 08/10/2018.     Allergy: No Known Allergies  Social Hx:  Social History   Socioeconomic History  . Marital status: Single    Spouse name: Not on file  . Number of children: Not on file  . Years of education: Not on file  . Highest education level: Not on file  Occupational History  . Not on file  Social Needs  . Financial resource strain: Not on file  . Food insecurity:    Worry: Not on file    Inability: Not on file  . Transportation needs:    Medical: Not on file    Non-medical: Not on file  Tobacco Use  . Smoking status: Never Smoker  . Smokeless tobacco: Never Used   Substance and Sexual Activity  . Alcohol use: No  . Drug use: No  . Sexual activity: Never  Lifestyle  . Physical activity:    Days per week: Not on file    Minutes per session: Not on file  . Stress: Not on file  Relationships  . Social connections:    Talks on phone: Not on file    Gets together: Not on file    Attends religious service: Not on file    Active member of club or organization: Not on file    Attends meetings of clubs or organizations: Not on file    Relationship status: Not on file  . Intimate partner violence:    Fear of current or ex partner: Not on file    Emotionally abused: Not on file    Physically abused: Not on file    Forced sexual activity: Not on file  Other Topics Concern  . Not on file  Social History Narrative  . Not on file    Past Surgical Hx:  Past Surgical History:  Procedure Laterality Date  . FINGER SURGERY    . FOOT SURGERY    . REDUCTION MAMMAPLASTY Bilateral   . ROBOTIC ASSISTED TOTAL HYSTERECTOMY WITH BILATERAL SALPINGO OOPHERECTOMY N/A 07/16/2018   Procedure: XI ROBOTIC ASSISTED TOTAL HYSTERECTOMY WITH BILATERAL SALPINGO OOPHORECTOMY;  Surgeon: Everitt Amber, MD;  Location: [facility];  Service: Gynecology;  Laterality: N/A;    Past Medical Hx:  Past Medical History:  Diagnosis Date  . Diverticulitis   . Fibroid   . Hyperlipidemia   . Hypertension     Past Gynecological History:  SVD x 1 No LMP recorded. Patient is postmenopausal.  Family Hx:  Family History  Problem Relation Age of Onset  . Heart disease Mother   . Hypertension Mother   . Breast cancer Neg Hx     Review of Systems:  Constitutional  Feels well,    ENT Normal appearing ears and nares bilaterally Skin/Breast  No rash, sores, jaundice, itching, dryness Cardiovascular  No chest pain, shortness of breath, or edema  Pulmonary  No cough or wheeze.  Gastro Intestinal  No nausea, vomitting, or diarrhoea. No bright red blood per  rectum, no abdominal pain, change in bowel movement, or constipation.  Genito Urinary  No frequency, urgency, dysuria, no bleeding Musculo Skeletal  No myalgia, arthralgia, joint swelling or pain  Neurologic  No weakness, numbness, change in gait,  Psychology  No depression, anxiety, insomnia.   Vitals:  There were no vitals taken for this visit.  Physical Exam: WD in NAD Neck  Supple NROM, without any enlargements.  Lymph Node Survey No cervical supraclavicular or inguinal adenopathy Cardiovascular  Pulse normal rate, regularity and rhythm. S1 and S2 normal.  Lungs  Clear to auscultation bilateraly, without wheezes/crackles/rhonchi. Good  air movement.  Skin  No rash/lesions/breakdown  Psychiatry  Alert and oriented to person, place, and time  Abdomen  Normoactive bowel sounds, abdomen soft, non-tender and slightly obese without evidence of hernia. Incisions are well healed x 4. Back No CVA tenderness Genito Urinary  Vaginal cuff in tact, sutures present, no bleeding.  Rectal  deferred Extremities  No bilateral cyanosis, clubbing or edema.   Thereasa Solo, MD  08/10/2018, 2:17 PM

## 2018-11-02 ENCOUNTER — Other Ambulatory Visit: Payer: Self-pay | Admitting: Family Medicine

## 2018-11-02 DIAGNOSIS — Z1231 Encounter for screening mammogram for malignant neoplasm of breast: Secondary | ICD-10-CM

## 2018-11-13 ENCOUNTER — Other Ambulatory Visit: Payer: Self-pay

## 2018-11-13 ENCOUNTER — Ambulatory Visit
Admission: RE | Admit: 2018-11-13 | Discharge: 2018-11-13 | Disposition: A | Discharge status: Home / Self Care | Payer: Medicare Other | Source: Ambulatory Visit | Attending: Family Medicine | Admitting: Family Medicine

## 2018-11-13 DIAGNOSIS — Z1231 Encounter for screening mammogram for malignant neoplasm of breast: Secondary | ICD-10-CM

## 2019-03-23 ENCOUNTER — Other Ambulatory Visit: Payer: Medicare Other

## 2019-03-23 ENCOUNTER — Ambulatory Visit: Payer: Medicare Other | Attending: Internal Medicine

## 2019-03-23 DIAGNOSIS — Z23 Encounter for immunization: Secondary | ICD-10-CM | POA: Insufficient documentation

## 2019-03-23 NOTE — Progress Notes (Signed)
   Covid-19 Vaccination Clinic  Name:  Dana Gregory    MRN: [id number] DOB: 1945/07/06  03/23/2019  Dana Gregory was observed post Covid-19 immunization for 15 minutes without incidence. She was provided with Vaccine Information Sheet and instruction to access the V-Safe system.   Dana Gregory was instructed to call 911 with any severe reactions post vaccine: Marland Kitchen Difficulty breathing  . Swelling of your face and throat  . A fast heartbeat  . A bad rash all over your body  . Dizziness and weakness    Immunizations Administered    Name Date Dose VIS Date Route   Pfizer COVID-19 Vaccine 03/23/2019 12:09 PM 0.3 mL 02/12/2019 Intramuscular   Manufacturer: [place]   Lot: [id number]   [place]: [id number]

## 2019-04-13 ENCOUNTER — Ambulatory Visit: Payer: Medicare Other | Attending: Internal Medicine

## 2019-04-13 DIAGNOSIS — Z23 Encounter for immunization: Secondary | ICD-10-CM

## 2019-04-13 NOTE — Progress Notes (Signed)
   Covid-19 Vaccination Clinic  Name:  Dana Gregory    MRN: [id number] DOB: May 31, 1945  04/13/2019  [name] was observed post Covid-19 immunization for 15 minutes without incidence. She was provided with Vaccine Information Sheet and instruction to access the V-Safe system.   [name] was instructed to call 911 with any severe reactions post vaccine: Marland Kitchen Difficulty breathing  . Swelling of your face and throat  . A fast heartbeat  . A bad rash all over your body  . Dizziness and weakness    Immunizations Administered    Name Date Dose VIS Date Route   Pfizer COVID-19 Vaccine 04/13/2019 12:24 PM 0.3 mL 02/12/2019 Intramuscular   Manufacturer: [place]   Lot: [id number]   [place]: [id number]

## 2019-12-15 ENCOUNTER — Other Ambulatory Visit: Payer: Self-pay | Admitting: Family Medicine

## 2019-12-15 DIAGNOSIS — Z1231 Encounter for screening mammogram for malignant neoplasm of breast: Secondary | ICD-10-CM

## 2020-01-17 ENCOUNTER — Other Ambulatory Visit: Payer: Self-pay

## 2020-01-17 ENCOUNTER — Ambulatory Visit
Admission: RE | Admit: 2020-01-17 | Discharge: 2020-01-17 | Disposition: A | Discharge status: Home / Self Care | Payer: Medicare Other | Source: Ambulatory Visit | Attending: Family Medicine | Admitting: Family Medicine

## 2020-01-17 DIAGNOSIS — Z1231 Encounter for screening mammogram for malignant neoplasm of breast: Secondary | ICD-10-CM

## 2020-12-12 ENCOUNTER — Other Ambulatory Visit: Payer: Self-pay | Admitting: Family Medicine

## 2020-12-12 DIAGNOSIS — Z1231 Encounter for screening mammogram for malignant neoplasm of breast: Secondary | ICD-10-CM

## 2021-01-18 ENCOUNTER — Ambulatory Visit: Payer: Medicare Other

## 2021-01-24 ENCOUNTER — Ambulatory Visit: Payer: Medicare Other

## 2021-02-09 ENCOUNTER — Ambulatory Visit
Admission: RE | Admit: 2021-02-09 | Discharge: 2021-02-09 | Disposition: A | Discharge status: Home / Self Care | Payer: Medicare Other | Source: Ambulatory Visit | Attending: Family Medicine | Admitting: Family Medicine

## 2021-02-09 ENCOUNTER — Other Ambulatory Visit: Payer: Self-pay

## 2021-02-09 DIAGNOSIS — Z1231 Encounter for screening mammogram for malignant neoplasm of breast: Secondary | ICD-10-CM

## 2022-01-14 ENCOUNTER — Other Ambulatory Visit: Payer: Self-pay | Admitting: Family Medicine

## 2022-01-14 DIAGNOSIS — Z1231 Encounter for screening mammogram for malignant neoplasm of breast: Secondary | ICD-10-CM

## 2022-03-14 ENCOUNTER — Ambulatory Visit: Payer: Medicare Other

## 2022-05-01 ENCOUNTER — Ambulatory Visit
Admission: RE | Admit: 2022-05-01 | Discharge: 2022-05-01 | Disposition: A | Discharge status: Home / Self Care | Payer: Medicare Other | Source: Ambulatory Visit | Attending: Family Medicine | Admitting: Family Medicine

## 2022-05-01 DIAGNOSIS — Z1231 Encounter for screening mammogram for malignant neoplasm of breast: Secondary | ICD-10-CM

## 2023-01-14 ENCOUNTER — Other Ambulatory Visit: Payer: Self-pay

## 2023-01-14 ENCOUNTER — Emergency Department (HOSPITAL_BASED_OUTPATIENT_CLINIC_OR_DEPARTMENT_OTHER): Payer: Medicare Other

## 2023-01-14 ENCOUNTER — Encounter (HOSPITAL_BASED_OUTPATIENT_CLINIC_OR_DEPARTMENT_OTHER): Payer: Self-pay | Admitting: Emergency Medicine

## 2023-01-14 ENCOUNTER — Emergency Department (HOSPITAL_BASED_OUTPATIENT_CLINIC_OR_DEPARTMENT_OTHER)
Admission: EM | Admit: 2023-01-14 | Discharge: 2023-01-14 | Disposition: A | Discharge status: Home / Self Care | Payer: Medicare Other | Attending: Emergency Medicine | Admitting: Emergency Medicine

## 2023-01-14 DIAGNOSIS — R1032 Left lower quadrant pain: Secondary | ICD-10-CM | POA: Diagnosis present

## 2023-01-14 DIAGNOSIS — Z79899 Other long term (current) drug therapy: Secondary | ICD-10-CM | POA: Insufficient documentation

## 2023-01-14 DIAGNOSIS — I10 Essential (primary) hypertension: Secondary | ICD-10-CM | POA: Insufficient documentation

## 2023-01-14 LAB — CBC WITH DIFFERENTIAL/PLATELET
Abs Immature Granulocytes: 0.01 10*3/uL (ref 0.00–0.07)
Basophils Absolute: 0.1 10*3/uL (ref 0.0–0.1)
Basophils Relative: 1 %
Eosinophils Absolute: 0.1 10*3/uL (ref 0.0–0.5)
Eosinophils Relative: 1 %
HCT: 49.7 % — ABNORMAL HIGH (ref 36.0–46.0)
Hemoglobin: 16.2 g/dL — ABNORMAL HIGH (ref 12.0–15.0)
Immature Granulocytes: 0 %
Lymphocytes Relative: 39 %
Lymphs Abs: 3.1 10*3/uL (ref 0.7–4.0)
MCH: 28 pg (ref 26.0–34.0)
MCHC: 32.6 g/dL (ref 30.0–36.0)
MCV: 86 fL (ref 80.0–100.0)
Monocytes Absolute: 0.5 10*3/uL (ref 0.1–1.0)
Monocytes Relative: 7 %
Neutro Abs: 4.2 10*3/uL (ref 1.7–7.7)
Neutrophils Relative %: 52 %
Platelets: 519 10*3/uL — ABNORMAL HIGH (ref 150–400)
RBC: 5.78 MIL/uL — ABNORMAL HIGH (ref 3.87–5.11)
RDW: 14.5 % (ref 11.5–15.5)
WBC: 8 10*3/uL (ref 4.0–10.5)
nRBC: 0 % (ref 0.0–0.2)

## 2023-01-14 LAB — URINALYSIS, ROUTINE W REFLEX MICROSCOPIC
Bilirubin Urine: NEGATIVE
Glucose, UA: NEGATIVE mg/dL
Ketones, ur: NEGATIVE mg/dL
Nitrite: NEGATIVE
Protein, ur: NEGATIVE mg/dL
Specific Gravity, Urine: 1.015 (ref 1.005–1.030)
pH: 6 (ref 5.0–8.0)

## 2023-01-14 LAB — COMPREHENSIVE METABOLIC PANEL
ALT: 13 U/L (ref 0–44)
AST: 17 U/L (ref 15–41)
Albumin: 3.8 g/dL (ref 3.5–5.0)
Alkaline Phosphatase: 68 U/L (ref 38–126)
Anion gap: 10 (ref 5–15)
BUN: 8 mg/dL (ref 8–23)
CO2: 25 mmol/L (ref 22–32)
Calcium: 9 mg/dL (ref 8.9–10.3)
Chloride: 102 mmol/L (ref 98–111)
Creatinine, Ser: 0.86 mg/dL (ref 0.44–1.00)
GFR, Estimated: >60 mL/min (ref >60)
Glucose, Bld: 115 mg/dL — ABNORMAL HIGH (ref 70–99)
Potassium: 4.3 mmol/L (ref 3.5–5.1)
Sodium: 137 mmol/L (ref 135–145)
Total Bilirubin: 0.8 mg/dL (ref <1.2)
Total Protein: 7.4 g/dL (ref 6.5–8.1)

## 2023-01-14 LAB — LIPASE, BLOOD: Lipase: 23 U/L (ref 11–51)

## 2023-01-14 LAB — URINALYSIS, MICROSCOPIC (REFLEX)

## 2023-01-14 MED ORDER — IOHEXOL 300 MG/ML  SOLN
100.0000 mL | Freq: Once | INTRAMUSCULAR | Status: AC | PRN
  Administered 2023-01-14: 100 mL via INTRAVENOUS

## 2023-01-14 NOTE — Discharge Instructions (Signed)
It was a pleasure taking care of you today.  As discussed, your CT scan did not show evidence of diverticulitis.  It did show some gallstones without evidence of a gallbladder infection.  You may take over-the-counter ibuprofen or Tylenol as needed for abdominal pain.  Please follow-up with PCP if symptoms do not improve over the next 2 to 3 days.  Return to the ER for any worsening symptoms.

## 2023-01-14 NOTE — ED Triage Notes (Signed)
Pt c/o LLQ pain since Thurs; some diarrhea, denies NV

## 2023-01-14 NOTE — ED Provider Notes (Signed)
[name] Provider Note   CSN: [id number] Arrival date & time: 01/14/23  1153     History  Chief Complaint  Patient presents with   Abdominal Pain    Dana Gregory is a 77 y.o. female with a past medical history significant for hypertension, hyperlipidemia, and history of diverticulitis who presents to the ED due to left lower quadrant abdominal pain for the past few days associated with loose stool.  Patient states pain started in the left flank region and now transitioned to left lower quadrant over the past few days.  No nausea or vomiting.  Denies fever and chills.  Has had a colonoscopy in the past and diagnosed with diverticulosis.  Loose stool is nonbloody in nature.  No rash.  No injury to left flank/abdomen.  Previous hysterectomy however, no other abdominal operations. No urinary or vaginal symptoms. No concern for STIs.   History obtained from patient and past medical records. No interpreter used during encounter.       Home Medications Prior to Admission medications   Medication Sig Start Date End Date Taking? Authorizing Provider  amLODipine (NORVASC) 5 MG tablet Take 5 mg by mouth daily.    [provider]  Carboxymethylcellulose Sodium (ARTIFICIAL TEARS OP) Place 1 drop into both eyes daily as needed (dry eyes).    [provider]  latanoprost (XALATAN) 0.005 % ophthalmic solution Place 1 drop into the right eye at bedtime.    [provider]  pravastatin (PRAVACHOL) 40 MG tablet Take 40 mg by mouth every evening.     [provider]      Allergies    Patient has no known allergies.    Review of Systems   Review of Systems  Constitutional:  Negative for chills and fever.  Respiratory:  Negative for shortness of breath.   Cardiovascular:  Negative for chest pain.  Gastrointestinal:  Positive for abdominal pain and diarrhea. Negative for nausea and vomiting.   Genitourinary:  Negative for dysuria.    Physical Exam Updated Vital Signs BP (!) 148/80   Pulse 80   Temp 98.3 F (36.8 C)   Resp 20   Ht 5' 3.5" (1.613 m)   Wt 95.3 kg   SpO2 96%   BMI 36.62 kg/m  Physical Exam Vitals and nursing note reviewed.  Constitutional:      General: She is not in acute distress.    Appearance: She is not ill-appearing.  HENT:     Head: Normocephalic.  Eyes:     Pupils: Pupils are equal, round, and reactive to light.  Cardiovascular:     Rate and Rhythm: Normal rate and regular rhythm.     Pulses: Normal pulses.     Heart sounds: Normal heart sounds. No murmur heard.    No friction rub. No gallop.  Pulmonary:     Effort: Pulmonary effort is normal.     Breath sounds: Normal breath sounds.  Abdominal:     General: Abdomen is flat. There is no distension.     Palpations: Abdomen is soft.     Tenderness: There is abdominal tenderness. There is no guarding or rebound.     Comments: Mild LLQ tenderness  Musculoskeletal:        General: Normal range of motion.     Cervical back: Neck supple.  Skin:    General: Skin is warm and dry.  Neurological:     General: No focal deficit present.  Mental Status: She is alert.  Psychiatric:        Mood and Affect: Mood normal.        Behavior: Behavior normal.     ED Results / Procedures / Treatments   Labs (all labs ordered are listed, but only abnormal results are displayed) Labs Reviewed  COMPREHENSIVE METABOLIC PANEL - Abnormal; Notable for the following components:      Result Value   Glucose, Bld 115 (*)    All other components within normal limits  URINALYSIS, ROUTINE W REFLEX MICROSCOPIC - Abnormal; Notable for the following components:   Hgb urine dipstick TRACE (*)    Leukocytes,Ua SMALL (*)    All other components within normal limits  CBC WITH DIFFERENTIAL/PLATELET - Abnormal; Notable for the following components:   RBC 5.78 (*)    Hemoglobin 16.2 (*)    HCT 49.7 (*)     Platelets 519 (*)    All other components within normal limits  URINALYSIS, MICROSCOPIC (REFLEX) - Abnormal; Notable for the following components:   Bacteria, UA RARE (*)    All other components within normal limits  URINE CULTURE  LIPASE, BLOOD    EKG None  Radiology CT ABDOMEN PELVIS W CONTRAST  Result Date: 01/14/2023 CLINICAL DATA:  Left lower quadrant pain for 5 days EXAM: CT ABDOMEN AND PELVIS WITH CONTRAST TECHNIQUE: Multidetector CT imaging of the abdomen and pelvis was performed using the standard protocol following bolus administration of intravenous contrast. RADIATION DOSE REDUCTION: This exam was performed according to the departmental dose-optimization program which includes automated exposure control, adjustment of the mA and/or kV according to patient size and/or use of iterative reconstruction technique. CONTRAST:  OMNIPAQUE IOHEXOL 300 MG/ML  SOLN COMPARISON:  04/03/2018 from [facility]. FINDINGS: Lower chest: Clear lung bases. Normal heart size without pericardial or pleural effusion. Hepatobiliary: Normal liver. Stone filled gallbladder without acute cholecystitis or biliary duct dilatation. Pancreas: Pancreatic fatty atrophy involving the upstream body and tail. No duct dilatation or acute inflammation. Spleen: Normal in size, without focal abnormality. Adrenals/Urinary Tract: Normal adrenal glands. Normal kidneys, without hydronephrosis. Normal urinary bladder. Stomach/Bowel: Gastric antral underdistention. Scattered colonic diverticula. Normal terminal ileum and appendix. Normal small bowel. Vascular/Lymphatic: Aortic atherosclerosis. No abdominopelvic adenopathy. Reproductive: Hysterectomy.  No adnexal mass. Other: No significant free fluid.  No free intraperitoneal air. Musculoskeletal: Trace L4-5 anterolisthesis.  Lumbar spondylosis. IMPRESSION: 1.  No acute process or explanation for left lower quadrant pain. 2. Cholelithiasis without cholecystitis 3.  Aortic  Atherosclerosis (ICD10-I70.0). Electronically Signed   By: Jeronimo Greaves M.D.   On: 01/14/2023 16:51    Procedures Procedures    Medications Ordered in ED Medications  iohexol (OMNIPAQUE) 300 MG/ML solution 100 mL (100 mLs Intravenous Contrast Given 01/14/23 1402)    ED Course/ Medical Decision Making/ A&P Clinical Course as of 01/14/23 1718  Tue Jan 14, 2023  1305 Glori LuisMarland Kitchen): SMALL [CA]  1305 Bacteria, UA(!): RARE [CA]  1305 WBC, UA: 0-5 [CA]  1305 Squamous Epithelial / HPF: 6-10 [CA]    Clinical Course User Index [CA] Mannie Stabile, PA-C                                 Medical Decision Making Amount and/or Complexity of Data Reviewed Labs: ordered. Decision-making details documented in ED Course. Radiology: ordered and independent interpretation performed. Decision-making details documented in ED Course.  Risk Prescription drug management.   This  patient presents to the ED for concern of LLQ abdominal pain, this involves an extensive number of treatment options, and is a complaint that carries with it a high risk of complications and morbidity.  The differential diagnosis includes diverticulitis, appendicitis, gastroenteritis, bowel obstruction, UTI, kidney stone, etc  77 year old female presents to the ED due to left lower quadrant abdominal pain for the past few days associated with loose stool.  Previous history of diverticulitis.  No fever or chills.  Upon arrival, stable vitals.  Patient in no acute distress.  Patient well-appearing on exam.  Abdomen soft, nondistended with very mild left lower quadrant tenderness.  No rash in left lower quadrant/flank region to suggest shingles.  Routine labs ordered in triage.  Added CT abdomen to rule out evidence of diverticulitis.  Patient declined pain medication.  CBC with no leukocytosis.  Elevated hemoglobin at 16.2 and platelets at 519.  CMP reassuring.  Normal renal function.  No major electrolyte derangements.   Lipase normal.  Low suspicion for pancreatitis.  UA significant for trace hematuria and small leukocytes.  Rare bacteria and 0-5 white blood cells.  Patient denies any urinary symptoms.  So will hold off on antibiotics at this time.  Urine culture pending.  CT abdomen personally reviewed and interpreted which is negative for any acute abnormalities that could explain her left lower quadrant abdominal pain.  Possible MSK etiology?.  Does demonstrate gallstones without evidence of cholecystitis.  Advised patient to take over-the-counter ibuprofen or Tylenol as needed for pain.  Patient stable for discharge. Strict ED precautions discussed with patient. Patient states understanding and agrees to plan. Patient discharged home in no acute distress and stable vitals  Co morbidities that complicate the patient evaluation  HTN  Social Determinants of Health:  elderly  Discussed with Dr. Jearld Fenton who agrees with assessment and plan.        Final Clinical Impression(s) / ED Diagnoses Final diagnoses:  LLQ abdominal pain    Rx / DC Orders ED Discharge Orders     None         Mannie Stabile, PA-C 01/14/23 1718    Loetta Rough, MD 01/15/23 [phone]

## 2023-01-14 NOTE — ED Notes (Signed)
Lab notified of urine culture 

## 2023-01-16 LAB — URINE CULTURE

## 2023-04-01 ENCOUNTER — Other Ambulatory Visit: Payer: Self-pay | Admitting: Family Medicine

## 2023-04-01 DIAGNOSIS — Z1231 Encounter for screening mammogram for malignant neoplasm of breast: Secondary | ICD-10-CM

## 2023-04-26 ENCOUNTER — Other Ambulatory Visit: Payer: Self-pay | Admitting: Family Medicine

## 2023-04-26 DIAGNOSIS — E2839 Other primary ovarian failure: Secondary | ICD-10-CM

## 2023-04-28 ENCOUNTER — Other Ambulatory Visit: Payer: Self-pay | Admitting: Physician Assistant

## 2023-04-28 DIAGNOSIS — Z13820 Encounter for screening for osteoporosis: Secondary | ICD-10-CM

## 2023-05-06 ENCOUNTER — Ambulatory Visit
Admission: RE | Admit: 2023-05-06 | Discharge: 2023-05-06 | Disposition: A | Discharge status: Home / Self Care | Payer: Medicare Other | Source: Ambulatory Visit | Attending: Family Medicine

## 2023-05-06 DIAGNOSIS — Z1231 Encounter for screening mammogram for malignant neoplasm of breast: Secondary | ICD-10-CM

## 2023-05-31 ENCOUNTER — Other Ambulatory Visit: Payer: Medicare Other

## 2023-12-16 ENCOUNTER — Other Ambulatory Visit
